# Patient Record
Sex: Female | Born: 1960 | Race: Black or African American | Hispanic: No | Smoking: Never smoker
Health system: Southern US, Community
[De-identification: ages and names within clinical notes are randomized; demographics above are authoritative.]

## PROBLEM LIST (undated history)

## (undated) DIAGNOSIS — I10 Essential (primary) hypertension: Secondary | ICD-10-CM

## (undated) DIAGNOSIS — J45909 Unspecified asthma, uncomplicated: Secondary | ICD-10-CM

## (undated) HISTORY — PX: APPENDECTOMY: SHX54

## (undated) HISTORY — PX: ABDOMINAL HYSTERECTOMY: SHX81

---

## 1992-05-26 HISTORY — PX: BREAST BIOPSY: SHX20

## 2005-09-25 ENCOUNTER — Ambulatory Visit: Payer: Self-pay

## 2005-10-02 ENCOUNTER — Ambulatory Visit: Payer: Self-pay

## 2007-04-19 ENCOUNTER — Ambulatory Visit: Payer: Self-pay | Admitting: Unknown Physician Specialty

## 2007-09-27 ENCOUNTER — Emergency Department: Payer: Self-pay | Admitting: Emergency Medicine

## 2007-12-06 ENCOUNTER — Ambulatory Visit: Payer: Self-pay | Admitting: Internal Medicine

## 2009-01-10 ENCOUNTER — Emergency Department: Payer: Self-pay | Admitting: Emergency Medicine

## 2009-01-22 ENCOUNTER — Encounter: Payer: Self-pay | Admitting: Internal Medicine

## 2009-01-24 ENCOUNTER — Encounter: Payer: Self-pay | Admitting: Internal Medicine

## 2009-02-23 ENCOUNTER — Encounter: Payer: Self-pay | Admitting: Internal Medicine

## 2010-07-11 ENCOUNTER — Ambulatory Visit: Payer: Self-pay

## 2010-07-12 ENCOUNTER — Other Ambulatory Visit: Payer: Self-pay | Admitting: Internal Medicine

## 2011-04-09 ENCOUNTER — Emergency Department: Payer: Self-pay

## 2011-10-01 ENCOUNTER — Ambulatory Visit: Payer: Self-pay | Admitting: Internal Medicine

## 2012-02-01 ENCOUNTER — Emergency Department: Payer: Self-pay | Admitting: Emergency Medicine

## 2013-02-08 ENCOUNTER — Ambulatory Visit: Payer: Self-pay | Admitting: Internal Medicine

## 2013-02-22 ENCOUNTER — Ambulatory Visit: Payer: Self-pay | Admitting: Internal Medicine

## 2013-05-09 ENCOUNTER — Ambulatory Visit: Payer: Self-pay | Admitting: Unknown Physician Specialty

## 2013-05-11 LAB — PATHOLOGY REPORT

## 2013-10-06 ENCOUNTER — Ambulatory Visit: Payer: Self-pay | Admitting: Family Medicine

## 2014-04-14 ENCOUNTER — Ambulatory Visit: Payer: Self-pay | Admitting: Family Medicine

## 2014-04-25 ENCOUNTER — Ambulatory Visit: Payer: Self-pay | Admitting: Family Medicine

## 2014-08-31 ENCOUNTER — Encounter: Payer: Self-pay | Admitting: *Deleted

## 2014-09-12 ENCOUNTER — Emergency Department: Admit: 2014-09-12 | Disposition: A | Discharge status: Home / Self Care | Payer: Self-pay | Admitting: Student

## 2014-09-12 LAB — COMPREHENSIVE METABOLIC PANEL
Albumin: 4.3 g/dL
Alkaline Phosphatase: 89 U/L
Anion Gap: 5 — ABNORMAL LOW (ref 7–16)
BILIRUBIN TOTAL: 0.7 mg/dL
BUN: 9 mg/dL
CALCIUM: 8.8 mg/dL — AB
Chloride: 108 mmol/L
Co2: 25 mmol/L
Creatinine: 0.58 mg/dL
EGFR (African American): >60
Glucose: 88 mg/dL
POTASSIUM: 3.5 mmol/L
SGOT(AST): 21 U/L
SGPT (ALT): 18 U/L
Sodium: 138 mmol/L
Total Protein: 7.7 g/dL

## 2014-09-12 LAB — CBC
HCT: 43.2 % (ref 35.0–47.0)
HGB: 14.6 g/dL (ref 12.0–16.0)
MCH: 30.5 pg (ref 26.0–34.0)
MCHC: 33.8 g/dL (ref 32.0–36.0)
MCV: 90 fL (ref 80–100)
Platelet: 234 10*3/uL (ref 150–440)
RBC: 4.79 10*6/uL (ref 3.80–5.20)
RDW: 13.1 % (ref 11.5–14.5)
WBC: 7.3 10*3/uL (ref 3.6–11.0)

## 2014-09-12 LAB — URINALYSIS, COMPLETE
BILIRUBIN, UR: NEGATIVE
Glucose,UR: NEGATIVE mg/dL (ref 0–75)
Ketone: NEGATIVE
Leukocyte Esterase: NEGATIVE
Nitrite: NEGATIVE
PH: 6 (ref 4.5–8.0)
Protein: NEGATIVE
Specific Gravity: 1.006 (ref 1.003–1.030)

## 2014-10-10 ENCOUNTER — Other Ambulatory Visit: Payer: Self-pay | Admitting: Family Medicine

## 2014-10-10 DIAGNOSIS — N83201 Unspecified ovarian cyst, right side: Secondary | ICD-10-CM

## 2014-10-13 ENCOUNTER — Ambulatory Visit: Payer: Self-pay

## 2014-11-17 ENCOUNTER — Other Ambulatory Visit: Payer: Self-pay | Admitting: Family Medicine

## 2014-11-17 DIAGNOSIS — R928 Other abnormal and inconclusive findings on diagnostic imaging of breast: Secondary | ICD-10-CM

## 2014-11-30 ENCOUNTER — Ambulatory Visit
Admission: RE | Admit: 2014-11-30 | Discharge: 2014-11-30 | Disposition: A | Discharge status: Home / Self Care | Payer: 59 | Source: Ambulatory Visit | Attending: Family Medicine | Admitting: Family Medicine

## 2014-11-30 ENCOUNTER — Other Ambulatory Visit: Payer: Self-pay | Admitting: Family Medicine

## 2014-11-30 DIAGNOSIS — R928 Other abnormal and inconclusive findings on diagnostic imaging of breast: Secondary | ICD-10-CM

## 2014-11-30 DIAGNOSIS — N63 Unspecified lump in breast: Secondary | ICD-10-CM | POA: Diagnosis not present

## 2015-02-01 ENCOUNTER — Ambulatory Visit: Payer: 59 | Attending: Physician Assistant | Admitting: Physical Therapy

## 2015-02-01 ENCOUNTER — Encounter: Payer: Self-pay | Admitting: Physical Therapy

## 2015-02-01 DIAGNOSIS — R531 Weakness: Secondary | ICD-10-CM | POA: Diagnosis present

## 2015-02-01 DIAGNOSIS — M79604 Pain in right leg: Secondary | ICD-10-CM | POA: Insufficient documentation

## 2015-02-01 DIAGNOSIS — M545 Low back pain, unspecified: Secondary | ICD-10-CM

## 2015-02-01 NOTE — Therapy (Signed)
[facility] MAIN [facility] SERVICES [address] [place], [place], [address] Phone: [phone]   Fax:  [phone]  Physical Therapy Evaluation  Patient Details  Name: Roberta Goodwin MRN: [id number] Date of Birth: August 21, 1960 Referring Provider:  Allen Derry, MD  Encounter Date: 02/01/2015      PT End of Session - 02/01/15 1329    Visit Number 1   Number of Visits 17   Date for PT Re-Evaluation 03/29/15   PT Start Time 0100   PT Stop Time 0200   PT Time Calculation (min) 60 min   Activity Tolerance Patient limited by fatigue;Patient limited by pain   Behavior During Therapy [facility] for tasks assessed/performed      No past medical history on file.  Past Surgical History  Procedure Laterality Date  . Breast biopsy Left 1994    negative    There were no vitals filed for this visit.  Visit Diagnosis:  Low back pain at multiple sites  Weakness  Right leg pain      Subjective Assessment - 02/01/15 1307    Subjective Patient has constant pain that goes down her RLE across her thigh to her left knee.    Currently in Pain? Yes   Pain Location Leg   Pain Orientation Left   Pain Descriptors / Indicators Aching   Pain Type Chronic pain   Pain Onset Other (comment)  one year   Pain Frequency Constant   Aggravating Factors  walking, sitting,    Pain Relieving Factors medicine, posititons in bed sometimes makes it better , lying    Evaluation findings;  Gait : WNL short, intermediate and long distances Strength 3+/5 hip extension bilaterally, hip flex bilaterally 3/5 with pain, ankle strength bilatrerally 5/5 ROM trunk guarded and repeated movements do not make it better SB R is pain ful, SB Left is tender, flex no pain but guarded, extension Sensation: WFL BLE Special tests : - SLR bilaterally, + FABER RLE,  + spring test L1- L5 Outcome measures: TUG : 6.32 sec 10MW:1.74 m/sec 5 x sit to stand: 15.21  6 MW:1225 feet      [facility]  PT Assessment - 02/01/15 0001    Assessment   Medical Diagnosis LBP   Onset Date/Surgical Date 11/24/14   Hand Dominance Right   Next MD Visit 02/15/15   Home Environment   Living Environment Private residence   Living Arrangements Alone   Available Help at Discharge Friend(s)   Type of Home Apartment   Home Access Stairs to enter   Prior Function   Level of Independence Independent                           PT Education - 02/01/15 1329    Education provided Yes   Education Details ice and HEP   Person(s) Educated Patient   Methods Explanation   Comprehension Verbalized understanding             PT Long Term Goals - 02/01/15 1342    PT LONG TERM GOAL #1   Title Patient will be independent in home exercise program to improve strength/mobility for better functional independence with ADLs  11//16   PT LONG TERM GOAL #2   Title Patient (< 60 years old) will complete five times sit to stand test in < 10 seconds indicating an increased LE strength and improved balance  03/29/15   PT LONG TERM GOAL #3  Title Patient will report a worst pain of 3/10 on VAS in  right leg    to improve tolerance with ADLs and reduced symptoms with activities 03/29/15               Plan - 02/01/15 1330    Clinical Impression Statement Patient is 54 yr old female with RLE sciatiac pain that is constatnt 5/10 and LBP that has been duration of one year. She has poor ROM trunk and + R FABER, She has tenderness to palpation to L1-L5 and paraspinal musculature.    Pt will benefit from skilled therapeutic intervention in order to improve on the following deficits Pain;Decreased range of motion;Decreased mobility;Decreased strength   Rehab Potential Good   PT Frequency 2x / week   PT Duration 8 weeks   PT Treatment/Interventions Manual techniques;Therapeutic exercise;Therapeutic activities;Stair training;Electrical Stimulation;Cryotherapy;Moist Heat;Traction;Dry  needling;Patient/family education   PT Next Visit Plan core strengthening   PT Home Exercise Plan piriformis stretch and TA contraction wiht hooklying marching   Consulted and Agree with Plan of Care Patient         Problem List There are no active problems to display for this patient.   [name] 02/01/2015, 1:47 PM  [facility] MAIN [facility] SERVICES [address] [place], [place], [address] Phone: [phone]   Fax:  [phone]

## 2015-02-05 ENCOUNTER — Encounter: Payer: Self-pay | Admitting: Physical Therapy

## 2015-02-05 ENCOUNTER — Ambulatory Visit: Payer: 59 | Admitting: Physical Therapy

## 2015-02-05 DIAGNOSIS — M545 Low back pain, unspecified: Secondary | ICD-10-CM

## 2015-02-05 DIAGNOSIS — R531 Weakness: Secondary | ICD-10-CM

## 2015-02-05 DIAGNOSIS — M79604 Pain in right leg: Secondary | ICD-10-CM

## 2015-02-05 NOTE — Therapy (Signed)
[facility] MAIN [facility] SERVICES [address] [place], [place], [address] Phone: [phone]   Fax:  [phone]  Physical Therapy Treatment  Patient Details  Name: Roberta Goodwin MRN: [id number] Date of Birth: [birth year] Referring Provider:  Allen Derry, MD  Encounter Date: 02/05/2015      PT End of Session - 02/05/15 1711    Visit Number 2   Number of Visits 17   Date for PT Re-Evaluation 03/29/15   PT Start Time 0445   PT Stop Time 0530   PT Time Calculation (min) 45 min   Activity Tolerance Patient limited by fatigue;Patient limited by pain   Behavior During Therapy [facility] for tasks assessed/performed      History reviewed. No pertinent past medical history.  Past Surgical History  Procedure Laterality Date  . Breast biopsy Left 1994    negative    There were no vitals filed for this visit.  Visit Diagnosis:  Low back pain at multiple sites  Weakness  Right leg pain      Subjective Assessment - 02/05/15 1645    Subjective (p) Patient has constant pain that goes down her RLE across her thigh to her left knee.    Pain Onset (p) Other (comment)  one year      Manual therapy;  STM to paraspinal muscles PA glides L3,4 ,5 and S 1 Prone extension x 10 x 2 Prone extension with overpressure and 10 x 2  E-stim IFC to left low back and SI joint x 20 mintues                           PT Education - 02/05/15 1710    Education provided Yes   Education Details HEP progression   Person(s) Educated Patient   Methods Explanation   Comprehension Verbalized understanding             PT Long Term Goals - 02/01/15 1342    PT LONG TERM GOAL #1   Title Patient will be independent in home exercise program to improve strength/mobility for better functional independence with ADLs  11//16   PT LONG TERM GOAL #2   Title Patient (< 72 years old) will complete five times sit to stand test in < 10 seconds  indicating an increased LE strength and improved balance  03/29/15   PT LONG TERM GOAL #3   Title Patient will report a worst pain of 3/10 on VAS in  right leg    to improve tolerance with ADLs and reduced symptoms with activities 03/29/15               Plan - 02/05/15 1711    Clinical Impression Statement Patient is havng less pain to right buttocks and right knee. Patient has less tingling in RLE that gets better with long axis distraction and STM to paraspinal muscles.    Pt will benefit from skilled therapeutic intervention in order to improve on the following deficits Pain;Decreased range of motion;Decreased mobility;Decreased strength   Rehab Potential Good   PT Frequency 2x / week   PT Duration 8 weeks   PT Treatment/Interventions Manual techniques;Therapeutic exercise;Therapeutic activities;Stair training;Electrical Stimulation;Cryotherapy;Moist Heat;Traction;Dry needling;Patient/family education   PT Next Visit Plan core strengthening   PT Home Exercise Plan piriformis stretch and TA contraction wiht hooklying marching   Consulted and Agree with Plan of Care Patient        Problem List There are  no active problems to display for this patient.   Roberta Goodwin 02/05/2015, 5:17 PM  [facility] MAIN [facility] SERVICES [address] [place], [place], [address] Phone: [phone]   Fax:  [phone]

## 2015-02-07 ENCOUNTER — Ambulatory Visit: Payer: 59 | Admitting: Physical Therapy

## 2015-02-12 ENCOUNTER — Encounter: Payer: Self-pay | Admitting: Physical Therapy

## 2015-02-12 ENCOUNTER — Ambulatory Visit: Payer: 59 | Admitting: Physical Therapy

## 2015-02-12 DIAGNOSIS — M545 Low back pain, unspecified: Secondary | ICD-10-CM

## 2015-02-12 DIAGNOSIS — R531 Weakness: Secondary | ICD-10-CM

## 2015-02-12 DIAGNOSIS — M79604 Pain in right leg: Secondary | ICD-10-CM

## 2015-02-12 NOTE — Therapy (Signed)
[facility] MAIN [facility] SERVICES [address] [place], [place], [address] Phone: [phone]   Fax:  [phone]  Physical Therapy Treatment  Patient Details  Name: Roberta Goodwin MRN: [id number] Date of Birth: 1960/09/18 Referring Provider:  Allen Derry, MD  Encounter Date: 02/12/2015      PT End of Session - 02/12/15 1705    Visit Number 3   Number of Visits 17   Date for PT Re-Evaluation 03/29/15   PT Start Time 0445   PT Stop Time 0530   PT Time Calculation (min) 45 min   Activity Tolerance Patient limited by fatigue;Patient limited by pain   Behavior During Therapy [facility] for tasks assessed/performed      History reviewed. No pertinent past medical history.  Past Surgical History  Procedure Laterality Date  . Breast biopsy Left 1994    negative    There were no vitals filed for this visit.  Visit Diagnosis:  Low back pain at multiple sites  Weakness  Right leg pain      Subjective Assessment - 02/12/15 1701    Subjective Patient has pain in her back and across R:E 6/10. It goes away if she is sleeping   Pain Onset Other (comment)  one year        E-stim to low back IFC and crossed pattern x 20 minuets Prone extension exercises x 10 x 2 with overpressure PA glides grade 2 and 3 to L5-S1 ,  STM to paraspinal muscles  Patients pain decreases to 4/10 in right thigh                           PT Education - 02/12/15 1704    Education provided Yes   Education Details HEP   Person(s) Educated Patient   Methods Explanation   Comprehension Verbalized understanding             PT Long Term Goals - 02/01/15 1342    PT LONG TERM GOAL #1   Title Patient will be independent in home exercise program to improve strength/mobility for better functional independence with ADLs  11//16   PT LONG TERM GOAL #2   Title Patient (< 14 years old) will complete five times sit to stand test in < 10 seconds  indicating an increased LE strength and improved balance  03/29/15   PT LONG TERM GOAL #3   Title Patient will report a worst pain of 3/10 on VAS in  right leg    to improve tolerance with ADLs and reduced symptoms with activities 03/29/15               Plan - 02/12/15 1705    Clinical Impression Statement Patient responds well to e-stim and  prone extension exercises.    Pt will benefit from skilled therapeutic intervention in order to improve on the following deficits Pain;Decreased range of motion;Decreased mobility;Decreased strength   Rehab Potential Good   PT Frequency 2x / week   PT Duration 8 weeks   PT Treatment/Interventions Manual techniques;Therapeutic exercise;Therapeutic activities;Stair training;Electrical Stimulation;Cryotherapy;Moist Heat;Traction;Dry needling;Patient/family education   PT Next Visit Plan core strengthening   PT Home Exercise Plan piriformis stretch and TA contraction wiht hooklying marching   Consulted and Agree with Plan of Care Patient        Problem List There are no active problems to display for this patient.   Jones Broom S 02/12/2015, 5:16 PM  [facility] MAIN [facility] SERVICES [address] [place], [place], [address] Phone: [phone]   Fax:  [phone]

## 2015-02-14 ENCOUNTER — Encounter: Payer: Self-pay | Admitting: Physical Therapy

## 2015-02-14 ENCOUNTER — Ambulatory Visit: Payer: 59 | Admitting: Physical Therapy

## 2015-02-14 DIAGNOSIS — M545 Low back pain, unspecified: Secondary | ICD-10-CM

## 2015-02-14 DIAGNOSIS — M79604 Pain in right leg: Secondary | ICD-10-CM

## 2015-02-14 DIAGNOSIS — R531 Weakness: Secondary | ICD-10-CM

## 2015-02-14 NOTE — Therapy (Signed)
[facility] MAIN [facility] SERVICES [address] [place], [place], [address] Phone: [phone]   Fax:  [phone]  Physical Therapy Treatment  Patient Details  Name: Roberta Goodwin MRN: [id number] Date of Birth: December 15, 1960 Referring Provider:  Allen Derry, MD  Encounter Date: 02/14/2015      PT End of Session - 02/14/15 1728    Visit Number 4   Number of Visits 17   Date for PT Re-Evaluation 03/29/15   PT Start Time 0515   PT Stop Time 0555   PT Time Calculation (min) 40 min   Activity Tolerance Patient limited by fatigue;Patient limited by pain   Behavior During Therapy [facility] for tasks assessed/performed      History reviewed. No pertinent past medical history.  Past Surgical History  Procedure Laterality Date  . Breast biopsy Left 1994    negative    There were no vitals filed for this visit.  Visit Diagnosis:  Low back pain at multiple sites  Weakness  Right leg pain      Subjective Assessment - 02/14/15 1716    Subjective Patient has pain in her back and across R:E 7/10. It goes away if she is sleeping   Currently in Pain? Yes   Pain Score 7           E-stim to low back IFC and crossed pattern x 20 minuets Prone extension exercises x 10 x 2 with overpressure Long axis distraction to RLE grade 3  STM to paraspinal muscles  Patients pain decreases to 5/10 in right thigh                              PT Long Term Goals - 02/01/15 1342    PT LONG TERM GOAL #1   Title Patient will be independent in home exercise program to improve strength/mobility for better functional independence with ADLs  11//16   PT LONG TERM GOAL #2   Title Patient (< 53 years old) will complete five times sit to stand test in < 10 seconds indicating an increased LE strength and improved balance  03/29/15   PT LONG TERM GOAL #3   Title Patient will report a worst pain of 3/10 on VAS in  right leg    to improve  tolerance with ADLs and reduced symptoms with activities 03/29/15               Plan - 02/14/15 1729    Clinical Impression Statement Patient has no relief frim long axis distraction but does have relief from e-stim and MH. She continues to have pain that wraps around her thigh into her knee on the right side.    Pt will benefit from skilled therapeutic intervention in order to improve on the following deficits Pain;Decreased range of motion;Decreased mobility;Decreased strength   Rehab Potential Good   PT Frequency 2x / week   PT Duration 8 weeks   PT Treatment/Interventions Manual techniques;Therapeutic exercise;Therapeutic activities;Stair training;Electrical Stimulation;Cryotherapy;Moist Heat;Traction;Dry needling;Patient/family education   PT Next Visit Plan core strengthening   PT Home Exercise Plan piriformis stretch and TA contraction wiht hooklying marching   Consulted and Agree with Plan of Care Patient        Problem List There are no active problems to display for this patient.   Jones Broom S 02/14/2015, 5:33 PM  [facility] [facility] MAIN [facility] SERVICES [address] [place],  [place], [address] Phone: [phone]   Fax:  [phone]

## 2015-02-19 ENCOUNTER — Other Ambulatory Visit: Payer: Self-pay | Admitting: Physical Medicine and Rehabilitation

## 2015-02-19 DIAGNOSIS — M5136 Other intervertebral disc degeneration, lumbar region: Secondary | ICD-10-CM

## 2015-02-28 ENCOUNTER — Ambulatory Visit
Admission: RE | Admit: 2015-02-28 | Discharge: 2015-02-28 | Disposition: A | Discharge status: Home / Self Care | Payer: 59 | Source: Ambulatory Visit | Attending: Physical Medicine and Rehabilitation | Admitting: Physical Medicine and Rehabilitation

## 2015-02-28 DIAGNOSIS — M5136 Other intervertebral disc degeneration, lumbar region: Secondary | ICD-10-CM | POA: Insufficient documentation

## 2015-03-06 ENCOUNTER — Encounter: Payer: Self-pay | Admitting: Physical Therapy

## 2015-03-06 ENCOUNTER — Ambulatory Visit: Payer: 59 | Attending: Physician Assistant | Admitting: Physical Therapy

## 2015-03-06 DIAGNOSIS — M545 Low back pain, unspecified: Secondary | ICD-10-CM

## 2015-03-06 DIAGNOSIS — R531 Weakness: Secondary | ICD-10-CM | POA: Diagnosis present

## 2015-03-06 DIAGNOSIS — M79604 Pain in right leg: Secondary | ICD-10-CM | POA: Insufficient documentation

## 2015-03-06 NOTE — Therapy (Signed)
[facility] MAIN [facility] SERVICES [address] [place], [place], [address] Phone: [phone]   Fax:  [phone]  Physical Therapy Treatment  Patient Details  Name: Roberta Goodwin MRN: [id number] Date of Birth: Oct 18, 1960 Referring Provider:  Allen Derry, MD  Encounter Date: 03/06/2015      PT End of Session - 03/06/15 1756    Visit Number 5   Number of Visits 17   Date for PT Re-Evaluation 03/29/15   PT Start Time 0530   PT Stop Time 0615   PT Time Calculation (min) 45 min   Activity Tolerance Patient limited by fatigue;Patient limited by pain   Behavior During Therapy [facility] for tasks assessed/performed      History reviewed. No pertinent past medical history.  Past Surgical History  Procedure Laterality Date  . Breast biopsy Left 1994    negative    There were no vitals filed for this visit.  Visit Diagnosis:  Low back pain at multiple sites  Weakness  Right leg pain      Subjective Assessment - 03/06/15 1755    Subjective Patient has pain in her back and across R:E 4/10. It goes away if she is sleeping   Pain Score 4    Pain Location Leg        Manual therpay: Long axis distraction grade 4 to RLE x 30 bouts x 5 PA glides grade 3 and 4 to L1- L5 x 30 bouts  Prone extension with overpressure to L 3, L4, L5 x 30 bouts x 3 TM x 10 mintues at 2.4 m /sec  Reviewed body mechanics and positioning and HEP.                          PT Education - 03/06/15 1756    Education provided Yes   Education Details HEP   Person(s) Educated Patient   Methods Explanation   Comprehension Verbalized understanding             PT Long Term Goals - 02/01/15 1342    PT LONG TERM GOAL #1   Title Patient will be independent in home exercise program to improve strength/mobility for better functional independence with ADLs  11//16   PT LONG TERM GOAL #2   Title Patient (< 80 years old) will complete five  times sit to stand test in < 10 seconds indicating an increased LE strength and improved balance  03/29/15   PT LONG TERM GOAL #3   Title Patient will report a worst pain of 3/10 on VAS in  right leg    to improve tolerance with ADLs and reduced symptoms with activities 03/29/15               Plan - 03/06/15 1757    Clinical Impression Statement Patient has pain relief after manual therapy and long axis distraction.    Pt will benefit from skilled therapeutic intervention in order to improve on the following deficits Pain;Decreased range of motion;Decreased mobility;Decreased strength   Rehab Potential Good   PT Frequency 2x / week   PT Duration 8 weeks   PT Treatment/Interventions Manual techniques;Therapeutic exercise;Therapeutic activities;Stair training;Electrical Stimulation;Cryotherapy;Moist Heat;Traction;Dry needling;Patient/family education   PT Next Visit Plan core strengthening   PT Home Exercise Plan piriformis stretch and TA contraction wiht hooklying marching   Consulted and Agree with Plan of Care Patient        Problem List There are no active  problems to display for this patient.   [name] 03/06/2015, 5:58 PM  [facility] MAIN [facility] SERVICES [address] [place], [place], [address] Phone: [phone]   Fax:  [phone]

## 2015-03-14 ENCOUNTER — Encounter: Payer: 59 | Admitting: Physical Therapy

## 2015-06-04 DIAGNOSIS — M5441 Lumbago with sciatica, right side: Secondary | ICD-10-CM | POA: Diagnosis not present

## 2015-06-28 DIAGNOSIS — G8929 Other chronic pain: Secondary | ICD-10-CM | POA: Diagnosis not present

## 2015-06-28 DIAGNOSIS — M5136 Other intervertebral disc degeneration, lumbar region: Secondary | ICD-10-CM | POA: Diagnosis not present

## 2015-06-28 DIAGNOSIS — M5441 Lumbago with sciatica, right side: Secondary | ICD-10-CM | POA: Diagnosis not present

## 2015-09-01 DIAGNOSIS — H524 Presbyopia: Secondary | ICD-10-CM | POA: Diagnosis not present

## 2015-09-13 DIAGNOSIS — M5432 Sciatica, left side: Secondary | ICD-10-CM | POA: Diagnosis not present

## 2015-10-15 DIAGNOSIS — I10 Essential (primary) hypertension: Secondary | ICD-10-CM | POA: Diagnosis not present

## 2015-10-15 DIAGNOSIS — L819 Disorder of pigmentation, unspecified: Secondary | ICD-10-CM | POA: Diagnosis not present

## 2015-10-15 DIAGNOSIS — R928 Other abnormal and inconclusive findings on diagnostic imaging of breast: Secondary | ICD-10-CM | POA: Diagnosis not present

## 2015-10-18 DIAGNOSIS — R928 Other abnormal and inconclusive findings on diagnostic imaging of breast: Secondary | ICD-10-CM | POA: Diagnosis not present

## 2015-10-18 DIAGNOSIS — N63 Unspecified lump in breast: Secondary | ICD-10-CM | POA: Diagnosis not present

## 2015-11-14 DIAGNOSIS — L608 Other nail disorders: Secondary | ICD-10-CM | POA: Diagnosis not present

## 2015-11-22 DIAGNOSIS — N63 Unspecified lump in breast: Secondary | ICD-10-CM | POA: Diagnosis not present

## 2016-05-05 DIAGNOSIS — I10 Essential (primary) hypertension: Secondary | ICD-10-CM | POA: Diagnosis not present

## 2016-05-05 DIAGNOSIS — M722 Plantar fascial fibromatosis: Secondary | ICD-10-CM | POA: Diagnosis not present

## 2016-05-05 DIAGNOSIS — Z0000 Encounter for general adult medical examination without abnormal findings: Secondary | ICD-10-CM | POA: Diagnosis not present

## 2016-05-05 DIAGNOSIS — K635 Polyp of colon: Secondary | ICD-10-CM | POA: Diagnosis not present

## 2016-05-12 DIAGNOSIS — M722 Plantar fascial fibromatosis: Secondary | ICD-10-CM | POA: Diagnosis not present

## 2016-10-07 DIAGNOSIS — H524 Presbyopia: Secondary | ICD-10-CM | POA: Diagnosis not present

## 2016-10-15 DIAGNOSIS — Z803 Family history of malignant neoplasm of breast: Secondary | ICD-10-CM | POA: Diagnosis not present

## 2016-10-15 DIAGNOSIS — M7989 Other specified soft tissue disorders: Secondary | ICD-10-CM | POA: Diagnosis not present

## 2016-10-15 DIAGNOSIS — Z87898 Personal history of other specified conditions: Secondary | ICD-10-CM | POA: Diagnosis not present

## 2016-10-16 ENCOUNTER — Other Ambulatory Visit: Payer: Self-pay | Admitting: Family Medicine

## 2016-10-16 DIAGNOSIS — Z87898 Personal history of other specified conditions: Secondary | ICD-10-CM

## 2016-10-16 DIAGNOSIS — Z803 Family history of malignant neoplasm of breast: Secondary | ICD-10-CM

## 2016-10-16 DIAGNOSIS — M7989 Other specified soft tissue disorders: Secondary | ICD-10-CM

## 2016-10-17 ENCOUNTER — Other Ambulatory Visit: Payer: Self-pay | Admitting: *Deleted

## 2016-10-17 ENCOUNTER — Inpatient Hospital Stay
Admission: RE | Admit: 2016-10-17 | Discharge: 2016-10-17 | Disposition: A | Discharge status: Home / Self Care | Payer: Self-pay | Source: Ambulatory Visit | Attending: *Deleted | Admitting: *Deleted

## 2016-10-17 DIAGNOSIS — Z9289 Personal history of other medical treatment: Secondary | ICD-10-CM

## 2016-10-22 ENCOUNTER — Other Ambulatory Visit: Payer: 59

## 2016-10-22 ENCOUNTER — Other Ambulatory Visit: Payer: Self-pay | Admitting: Family Medicine

## 2016-10-22 DIAGNOSIS — R590 Localized enlarged lymph nodes: Secondary | ICD-10-CM

## 2016-10-22 DIAGNOSIS — Z803 Family history of malignant neoplasm of breast: Secondary | ICD-10-CM

## 2016-10-22 DIAGNOSIS — Z87898 Personal history of other specified conditions: Secondary | ICD-10-CM

## 2016-10-22 DIAGNOSIS — M7989 Other specified soft tissue disorders: Secondary | ICD-10-CM

## 2016-10-30 ENCOUNTER — Ambulatory Visit
Admission: RE | Admit: 2016-10-30 | Discharge: 2016-10-30 | Disposition: A | Discharge status: Home / Self Care | Payer: 59 | Source: Ambulatory Visit | Attending: Family Medicine | Admitting: Family Medicine

## 2016-10-30 DIAGNOSIS — Z803 Family history of malignant neoplasm of breast: Secondary | ICD-10-CM | POA: Diagnosis not present

## 2016-10-30 DIAGNOSIS — N63 Unspecified lump in unspecified breast: Secondary | ICD-10-CM | POA: Diagnosis not present

## 2016-10-30 DIAGNOSIS — Z87898 Personal history of other specified conditions: Secondary | ICD-10-CM | POA: Insufficient documentation

## 2016-10-30 DIAGNOSIS — R928 Other abnormal and inconclusive findings on diagnostic imaging of breast: Secondary | ICD-10-CM | POA: Diagnosis not present

## 2016-10-30 DIAGNOSIS — N6489 Other specified disorders of breast: Secondary | ICD-10-CM | POA: Diagnosis not present

## 2016-10-30 DIAGNOSIS — R590 Localized enlarged lymph nodes: Secondary | ICD-10-CM

## 2016-10-30 DIAGNOSIS — M7989 Other specified soft tissue disorders: Secondary | ICD-10-CM | POA: Insufficient documentation

## 2017-06-15 ENCOUNTER — Emergency Department: Payer: 59

## 2017-06-15 ENCOUNTER — Emergency Department
Admission: EM | Admit: 2017-06-15 | Discharge: 2017-06-15 | Disposition: A | Discharge status: Home / Self Care | Payer: 59 | Attending: Emergency Medicine | Admitting: Emergency Medicine

## 2017-06-15 ENCOUNTER — Other Ambulatory Visit: Payer: Self-pay

## 2017-06-15 ENCOUNTER — Encounter: Payer: Self-pay | Admitting: Emergency Medicine

## 2017-06-15 DIAGNOSIS — R079 Chest pain, unspecified: Secondary | ICD-10-CM | POA: Diagnosis not present

## 2017-06-15 DIAGNOSIS — J40 Bronchitis, not specified as acute or chronic: Secondary | ICD-10-CM | POA: Diagnosis not present

## 2017-06-15 DIAGNOSIS — I10 Essential (primary) hypertension: Secondary | ICD-10-CM | POA: Insufficient documentation

## 2017-06-15 DIAGNOSIS — R51 Headache: Secondary | ICD-10-CM | POA: Diagnosis not present

## 2017-06-15 DIAGNOSIS — R05 Cough: Secondary | ICD-10-CM | POA: Diagnosis not present

## 2017-06-15 DIAGNOSIS — R0602 Shortness of breath: Secondary | ICD-10-CM | POA: Diagnosis not present

## 2017-06-15 HISTORY — DX: Essential (primary) hypertension: I10

## 2017-06-15 LAB — COMPREHENSIVE METABOLIC PANEL
ALT: 35 U/L (ref 14–54)
ANION GAP: 10 (ref 5–15)
AST: 33 U/L (ref 15–41)
Albumin: 4.3 g/dL (ref 3.5–5.0)
Alkaline Phosphatase: 118 U/L (ref 38–126)
BUN: 12 mg/dL (ref 6–20)
CHLORIDE: 105 mmol/L (ref 101–111)
CO2: 21 mmol/L — AB (ref 22–32)
Calcium: 10 mg/dL (ref 8.9–10.3)
Creatinine, Ser: 0.81 mg/dL (ref 0.44–1.00)
Glucose, Bld: 104 mg/dL — ABNORMAL HIGH (ref 65–99)
POTASSIUM: 3.4 mmol/L — AB (ref 3.5–5.1)
SODIUM: 136 mmol/L (ref 135–145)
Total Bilirubin: 0.5 mg/dL (ref 0.3–1.2)
Total Protein: 8.1 g/dL (ref 6.5–8.1)

## 2017-06-15 LAB — CBC
HCT: 45.4 % (ref 35.0–47.0)
Hemoglobin: 15.4 g/dL (ref 12.0–16.0)
MCH: 30.1 pg (ref 26.0–34.0)
MCHC: 34.1 g/dL (ref 32.0–36.0)
MCV: 88.3 fL (ref 80.0–100.0)
PLATELETS: 220 10*3/uL (ref 150–440)
RBC: 5.13 MIL/uL (ref 3.80–5.20)
RDW: 13.1 % (ref 11.5–14.5)
WBC: 9.4 10*3/uL (ref 3.6–11.0)

## 2017-06-15 LAB — FIBRIN DERIVATIVES D-DIMER (ARMC ONLY): FIBRIN DERIVATIVES D-DIMER (ARMC): 253.99 ng{FEU}/mL (ref 0.00–499.00)

## 2017-06-15 LAB — INFLUENZA PANEL BY PCR (TYPE A & B)
INFLAPCR: NEGATIVE
INFLBPCR: NEGATIVE

## 2017-06-15 LAB — TROPONIN I

## 2017-06-15 MED ORDER — AZITHROMYCIN 500 MG PO TABS
500.0000 mg | ORAL_TABLET | Freq: Once | ORAL | Status: AC
  Administered 2017-06-15: 500 mg via ORAL
  Filled 2017-06-15: qty 1

## 2017-06-15 MED ORDER — IPRATROPIUM-ALBUTEROL 0.5-2.5 (3) MG/3ML IN SOLN
3.0000 mL | Freq: Once | RESPIRATORY_TRACT | Status: AC
  Administered 2017-06-15: 3 mL via RESPIRATORY_TRACT
  Filled 2017-06-15: qty 3

## 2017-06-15 MED ORDER — PREDNISONE 10 MG PO TABS: ORAL_TABLET | ORAL | 0 refills | Status: DC

## 2017-06-15 MED ORDER — IBUPROFEN 400 MG PO TABS
600.0000 mg | ORAL_TABLET | Freq: Once | ORAL | Status: AC
  Administered 2017-06-15: 600 mg via ORAL
  Filled 2017-06-15: qty 2

## 2017-06-15 MED ORDER — SODIUM CHLORIDE 0.9 % IV BOLUS (SEPSIS)
1000.0000 mL | Freq: Once | INTRAVENOUS | Status: AC
  Administered 2017-06-15: 1000 mL via INTRAVENOUS

## 2017-06-15 MED ORDER — AZITHROMYCIN 250 MG PO TABS: ORAL_TABLET | ORAL | 0 refills | Status: DC

## 2017-06-15 MED ORDER — PREDNISONE 20 MG PO TABS
40.0000 mg | ORAL_TABLET | Freq: Once | ORAL | Status: AC
  Administered 2017-06-15: 40 mg via ORAL
  Filled 2017-06-15: qty 2

## 2017-06-15 NOTE — ED Provider Notes (Signed)
[facility] Emergency Department Provider Note ____________________________________________   I have reviewed the triage vital signs and the triage nursing note.  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  She took a cough medication which included phentermine prior to arrival today.  Symptoms are moderate.  Nothing makes it worse or better.   Past Medical History:  Diagnosis Date  . Hypertension     There are no active problems to display for this patient.   Past Surgical History:  Procedure Laterality Date  . ABDOMINAL HYSTERECTOMY    . BREAST BIOPSY Left 1994   negative    Prior to Admission medications   Medication Sig Start Date End Date Taking? Authorizing Provider  azithromycin (ZITHROMAX) 250 MG tablet 1 tab by mouth for 4 more days 06/15/17   Governor Rooks, MD  predniSONE (DELTASONE) 10 MG tablet 40mg  by mouth for 4 more days 06/15/17   Governor Rooks, MD    Allergies  Allergen Reactions  . Lisinopril Cough  . Prednisone Anxiety    Family History  Problem Relation Age of Onset  . Breast cancer Mother 29    Social History Social History   Tobacco Use  . Smoking status: Never Smoker  Substance Use Topics  . Alcohol use: Not on file  . Drug use: Not on file    Review of Systems  Constitutional: Negative for fever.  Today for chills. Eyes: Negative for visual changes. ENT: Negative for sore throat. Cardiovascular: Having some pains that she considers compressing type of pains or bandlike pains around the sides of her chest. Respiratory: Positive for shortness of  breath. Gastrointestinal: Negative for abdominal pain, vomiting and diarrhea. Genitourinary: Negative for dysuria. Musculoskeletal: Negative for back pain.  Today for body and muscle aches. Skin: Negative for rash. Neurological: Positive for mild global headache.  ____________________________________________   PHYSICAL EXAM:  VITAL SIGNS: ED Triage Vitals  Enc Vitals Group     BP 06/15/17 1112 (!) 179/113     Pulse Rate 06/15/17 1112 (!) 109     Resp 06/15/17 1112 20     Temp 06/15/17 1112 98.7 F (37.1 C)     Temp Source 06/15/17 1112 Oral     SpO2 06/15/17 1112 94 %     Weight 06/15/17 1113 197 lb (89.4 kg)     Height 06/15/17 1113 5\' 6"  (1.676 m)     Head Circumference --      Peak Flow --      Pain Score 06/15/17 1112 7     Pain Loc --      Pain Edu? --      Excl. in GC? --      Constitutional: Alert and oriented. Well appearing and in no distress. HEENT   Head: Normocephalic and atraumatic.      Eyes: Conjunctivae are normal. Pupils equal and round.       Ears:         Nose: No congestion/rhinnorhea.  Significant sinus congestion.   Mouth/Throat: Mucous membranes are moist.   Neck: No stridor. Cardiovascular/Chest: Tachycardic rate, regular rhythm.  No murmurs, rubs, or gallops. Respiratory: Normal  respiratory effort without tachypnea nor retractions.  Somewhat tight breath sounds without obvious wheezing, no rhonchi or rales. Gastrointestinal: Soft. No distention, no guarding, no rebound. Nontender.    Genitourinary/rectal:Deferred Musculoskeletal: Nontender with normal range of motion in all extremities. No joint effusions.  No lower extremity tenderness.  No edema. Neurologic:  Normal speech and language. No gross or focal neurologic deficits are appreciated. Skin:  Skin is warm, dry and intact. No rash noted. Psychiatric: Mood and affect are normal. Speech and behavior are normal. Patient exhibits appropriate insight and  judgment.   ____________________________________________  LABS (pertinent positives/negatives) I, Governor Rooksebecca Windie Marasco, MD the attending physician have reviewed the labs noted below.  Labs Reviewed  COMPREHENSIVE METABOLIC PANEL - Abnormal; Notable for the following components:      Result Value   Potassium 3.4 (*)    CO2 21 (*)    Glucose, Bld 104 (*)    All other components within normal limits  CBC  TROPONIN I  INFLUENZA PANEL BY PCR (TYPE A & B)  FIBRIN DERIVATIVES D-DIMER ([facility] ONLY)    ____________________________________________    EKG I, Governor Rooksebecca Zakhia Seres, MD, the attending physician have personally viewed and interpreted all ECGs.  110 bpm.  Sinus tachycardia.  Narrow QRS.  Normal axis.  Nonspecific ST and T wave ____________________________________________  RADIOLOGY All Xrays were viewed by me.  Imaging interpreted by Radiologist, and I, Governor Rooksebecca Delani Kohli, MD the attending physician have reviewed the radiologist interpretation noted below.  Chest x-ray 2 view:  IMPRESSION: There is no pneumonia nor other acute cardiopulmonary abnormality. __________________________________________  PROCEDURES  Procedure(s) performed: None  Critical Care performed: None   ____________________________________________  ED COURSE / ASSESSMENT AND PLAN  Pertinent labs & imaging results that were available during my care of the patient were reviewed by me and considered in my medical decision making (see chart for details).   Patient clearly has an upper respiratory infection with congestion and complaint of chest tightness with history of asthma.  No hypoxia or fever, but she does have chills here and seems like she is having some myalgias.  Her chest x-ray was clear for pneumonia.  We discussed treating for tight breath sounds with a history of asthma with a DuoNeb here.  I am going to add on an influenza test.  We discussed whether or not to pursue further investigation for PE  although I think patient is low risk given obvious other viral type symptoms for URI, we are going to pursue testing with d-dimer.  She is a little tachycardic here, this may be due to the stimulant and her cough medication prior to arrival.  Patient breathing little better after DuoNeb treatment.  She states that although prednisone gives her jitteriness, she states it does help with her lungs, so I am going to do a course of prednisone for bronchitis.  Given the systemic symptoms, I am also going to cover her with azithromycin and although we discussed it is not quite clear if this is bacterial or viral I will go ahead and cover in case there is a component of bacterial infection.  She has an appointment with her primary care doctor actually on Friday.  Negative for flu test.  D-dimer reassuring.  DIFFERENTIAL DIAGNOSIS: Including but not limited to influenza, upper respiratory viral infection, viral syndrome, pneumonia, asthma exacerbation, pulmonary embolism, ACS, bronchitis, sinusitis, etc.  CONSULTATIONS: None   Patient / Family / Caregiver informed of clinical course, medical decision-making process, and agree with plan.  I discussed return precautions, follow-up instructions, and discharge instructions with patient and/or family.  Discharge Instructions : You are evaluated for body aches and coughing and wheezing and are being treated for bronchitis/asthma exacerbation.  Continue to use your inhaler of albuterol every 4 hours as needed for wheezing.  You are being prescribed prednisone to help with inflammation in the lungs as well as azithromycin and a bacterial component to the upper respiratory infection.  Return to the emergency room immediately for any worsening condition including fevers, coughing up blood, dizziness or passing out, confusion or altered mental status, any chest pain or worsening trouble breathing, or any other symptoms concerning to  you.    ___________________________________________   FINAL CLINICAL IMPRESSION(S) / ED DIAGNOSES   Final diagnoses:  Bronchitis      ___________________________________________        Note: This dictation was prepared with Dragon dictation. Any transcriptional errors that result from this process are unintentional    Governor Rooks, MD 06/15/17 1553

## 2017-06-15 NOTE — ED Triage Notes (Signed)
States cough started yesterday. States chest pain started during the night but is unable to determine if it is related to her cough. Denies increase with inspiration.

## 2017-06-15 NOTE — Discharge Instructions (Signed)
You are evaluated for body aches and coughing and wheezing and are being treated for bronchitis/asthma exacerbation.  Continue to use your inhaler of albuterol every 4 hours as needed for wheezing.  You are being prescribed prednisone to help with inflammation in the lungs as well as azithromycin and a bacterial component to the upper respiratory infection.  Return to the emergency room immediately for any worsening condition including fevers, coughing up blood, dizziness or passing out, confusion or altered mental status, any chest pain or worsening trouble breathing, or any other symptoms concerning to you.

## 2017-06-19 DIAGNOSIS — Z23 Encounter for immunization: Secondary | ICD-10-CM | POA: Diagnosis not present

## 2017-06-19 DIAGNOSIS — Z6832 Body mass index (BMI) 32.0-32.9, adult: Secondary | ICD-10-CM | POA: Diagnosis not present

## 2017-06-19 DIAGNOSIS — Z0000 Encounter for general adult medical examination without abnormal findings: Secondary | ICD-10-CM | POA: Diagnosis not present

## 2017-06-19 DIAGNOSIS — I10 Essential (primary) hypertension: Secondary | ICD-10-CM | POA: Diagnosis not present

## 2017-06-22 DIAGNOSIS — E876 Hypokalemia: Secondary | ICD-10-CM | POA: Diagnosis not present

## 2017-07-20 DIAGNOSIS — E668 Other obesity: Secondary | ICD-10-CM | POA: Diagnosis not present

## 2017-07-20 DIAGNOSIS — Z6830 Body mass index (BMI) 30.0-30.9, adult: Secondary | ICD-10-CM | POA: Diagnosis not present

## 2017-08-25 DIAGNOSIS — Z6830 Body mass index (BMI) 30.0-30.9, adult: Secondary | ICD-10-CM | POA: Diagnosis not present

## 2017-08-25 DIAGNOSIS — R634 Abnormal weight loss: Secondary | ICD-10-CM | POA: Diagnosis not present

## 2017-10-05 DIAGNOSIS — H524 Presbyopia: Secondary | ICD-10-CM | POA: Diagnosis not present

## 2017-12-04 DIAGNOSIS — M21611 Bunion of right foot: Secondary | ICD-10-CM | POA: Diagnosis not present

## 2017-12-04 DIAGNOSIS — Z6829 Body mass index (BMI) 29.0-29.9, adult: Secondary | ICD-10-CM | POA: Diagnosis not present

## 2017-12-04 DIAGNOSIS — R0683 Snoring: Secondary | ICD-10-CM | POA: Diagnosis not present

## 2017-12-22 DIAGNOSIS — N390 Urinary tract infection, site not specified: Secondary | ICD-10-CM | POA: Diagnosis not present

## 2017-12-22 DIAGNOSIS — R399 Unspecified symptoms and signs involving the genitourinary system: Secondary | ICD-10-CM | POA: Diagnosis not present

## 2017-12-22 DIAGNOSIS — N12 Tubulo-interstitial nephritis, not specified as acute or chronic: Secondary | ICD-10-CM | POA: Diagnosis not present

## 2017-12-25 ENCOUNTER — Emergency Department
Admission: EM | Admit: 2017-12-25 | Discharge: 2017-12-25 | Discharge status: Against Medical Advice | Payer: 59 | Attending: Student in an Organized Health Care Education/Training Program | Admitting: Student in an Organized Health Care Education/Training Program

## 2017-12-25 ENCOUNTER — Other Ambulatory Visit: Payer: Self-pay

## 2017-12-25 ENCOUNTER — Encounter: Payer: Self-pay | Admitting: Emergency Medicine

## 2017-12-25 DIAGNOSIS — R82998 Other abnormal findings in urine: Secondary | ICD-10-CM | POA: Insufficient documentation

## 2017-12-25 DIAGNOSIS — Z1612 Extended spectrum beta lactamase (ESBL) resistance: Secondary | ICD-10-CM | POA: Insufficient documentation

## 2017-12-25 DIAGNOSIS — N3000 Acute cystitis without hematuria: Secondary | ICD-10-CM | POA: Diagnosis not present

## 2017-12-25 DIAGNOSIS — A499 Bacterial infection, unspecified: Secondary | ICD-10-CM

## 2017-12-25 HISTORY — DX: Unspecified asthma, uncomplicated: J45.909

## 2017-12-25 LAB — URINALYSIS, COMPLETE (UACMP) WITH MICROSCOPIC
Bacteria, UA: NONE SEEN
Bilirubin Urine: NEGATIVE
Glucose, UA: NEGATIVE mg/dL
Hgb urine dipstick: NEGATIVE
Ketones, ur: NEGATIVE mg/dL
LEUKOCYTES UA: NEGATIVE
NITRITE: NEGATIVE
PROTEIN: NEGATIVE mg/dL
SPECIFIC GRAVITY, URINE: 1.017 (ref 1.005–1.030)
Squamous Epithelial / LPF: NONE SEEN (ref 0–5)
pH: 7 (ref 5.0–8.0)

## 2017-12-25 LAB — COMPREHENSIVE METABOLIC PANEL
ALK PHOS: 97 U/L (ref 38–126)
ALT: 25 U/L (ref 0–44)
ANION GAP: 6 (ref 5–15)
AST: 29 U/L (ref 15–41)
Albumin: 4.1 g/dL (ref 3.5–5.0)
BILIRUBIN TOTAL: 0.5 mg/dL (ref 0.3–1.2)
BUN: 20 mg/dL (ref 6–20)
CALCIUM: 9.3 mg/dL (ref 8.9–10.3)
CO2: 24 mmol/L (ref 22–32)
Chloride: 110 mmol/L (ref 98–111)
Creatinine, Ser: 0.58 mg/dL (ref 0.44–1.00)
GFR calc non Af Amer: >60 mL/min (ref >60)
Glucose, Bld: 108 mg/dL — ABNORMAL HIGH (ref 70–99)
Potassium: 3.7 mmol/L (ref 3.5–5.1)
SODIUM: 140 mmol/L (ref 135–145)
TOTAL PROTEIN: 7 g/dL (ref 6.5–8.1)

## 2017-12-25 LAB — CBC
HCT: 39.6 % (ref 35.0–47.0)
Hemoglobin: 13.8 g/dL (ref 12.0–16.0)
MCH: 31.4 pg (ref 26.0–34.0)
MCHC: 35 g/dL (ref 32.0–36.0)
MCV: 90 fL (ref 80.0–100.0)
PLATELETS: 222 10*3/uL (ref 150–440)
RBC: 4.4 MIL/uL (ref 3.80–5.20)
RDW: 13 % (ref 11.5–14.5)
WBC: 8.1 10*3/uL (ref 3.6–11.0)

## 2017-12-25 MED ORDER — FLUCONAZOLE 150 MG PO TABS: 150.0000 mg | ORAL_TABLET | Freq: Every day | ORAL | 0 refills | Status: AC

## 2017-12-25 MED ORDER — NITROFURANTOIN MONOHYD MACRO 100 MG PO CAPS: 100.0000 mg | ORAL_CAPSULE | Freq: Two times a day (BID) | ORAL | 0 refills | Status: AC

## 2017-12-25 MED ORDER — NOREPINEPHRINE 4 MG/250ML-% IV SOLN: 0.0000 ug/min | Freq: Once | INTRAVENOUS | Status: DC

## 2017-12-25 MED ORDER — SODIUM CHLORIDE 0.9 % IV SOLN
1.0000 g | Freq: Once | INTRAVENOUS | Status: AC
  Administered 2017-12-25: 1000 mg via INTRAVENOUS
  Filled 2017-12-25: qty 1

## 2017-12-25 NOTE — ED Provider Notes (Signed)
[facility] Emergency Department Provider Note    First MD Initiated Contact with Patient 12/25/17 1651     (approximate)  I have reviewed the triage vital signs and the nursing notes.   HISTORY  Chief Complaint Flank Pain and Urinary Tract Infection    HPI Roberta Goodwin is a 57 y.o. female no significant past medical history presents the ER with chief complaint of abnormal urine culture.  Patient seen on the 30th urgent care for dysuria and flank pain.  Was given a shot of Rocephin and started on Cipro.  Has not had any fevers.  No nausea or vomiting.  No flank pain but is still having some urinary discomfort.  Patient was told to come to the ER as her urinalysis did test positive for ESBL E. coli resistant to Cipro.   Past Medical History:  Diagnosis Date  . Asthma   . Hypertension    Family History  Problem Relation Age of Onset  . Breast cancer Mother [id number]   Past Surgical History:  Procedure Laterality Date  . ABDOMINAL HYSTERECTOMY    . APPENDECTOMY    . BREAST BIOPSY Left 1994   negative   There are no active problems to display for this patient.     Prior to Admission medications   Medication Sig Start Date End Date Taking? Authorizing Provider  azithromycin (ZITHROMAX) 250 MG tablet 1 tab by mouth for 4 more days 06/15/17   Governor RooksLord, Rebecca, MD  fluconazole (DIFLUCAN) 150 MG tablet Take 1 tablet (150 mg total) by mouth daily for 1 day. 12/25/17 12/26/17  Willy Eddyobinson, Johne Buckle, MD  nitrofurantoin, macrocrystal-monohydrate, (MACROBID) 100 MG capsule Take 1 capsule (100 mg total) by mouth 2 (two) times daily for 5 days. 12/25/17 12/30/17  Willy Eddyobinson, Rayan Ines, MD  predniSONE (DELTASONE) 10 MG tablet 40mg  by mouth for 4 more days 06/15/17   Governor RooksLord, Rebecca, MD    Allergies Lisinopril and Prednisone    Social History Social History   Tobacco Use  . Smoking status: Never Smoker  . Smokeless tobacco: Never Used  Substance Use Topics  . Alcohol use:  Not Currently    Comment: occas.   . Drug use: Not on file    Review of Systems Patient denies headaches, rhinorrhea, blurry vision, numbness, shortness of breath, chest pain, edema, cough, abdominal pain, nausea, vomiting, diarrhea, dysuria, fevers, rashes or hallucinations unless otherwise stated above in HPI. ____________________________________________   PHYSICAL EXAM:  VITAL SIGNS: Vitals:   12/25/17 1900 12/25/17 1930  BP: (!) 164/96 (!) 152/89  Pulse: (!) 53 (!) 46  Resp: 18 (!) 22  Temp:    SpO2: 98% 95%    Constitutional: Alert and oriented.  Eyes: Conjunctivae are normal.  Head: Atraumatic. Nose: No congestion/rhinnorhea. Mouth/Throat: Mucous membranes are moist.   Neck: No stridor. Painless ROM.  Cardiovascular: Normal rate, regular rhythm. Grossly normal heart sounds.  Good peripheral circulation. Respiratory: Normal respiratory effort.  No retractions. Lungs CTAB. Gastrointestinal: Soft and nontender. No distention. No abdominal bruits. No CVA tenderness. Genitourinary:  Musculoskeletal: No lower extremity tenderness nor edema.  No joint effusions. Neurologic:  Normal speech and language. No gross focal neurologic deficits are appreciated. No facial droop Skin:  Skin is warm, dry and intact. No rash noted. Psychiatric: Mood and affect are normal. Speech and behavior are normal.  ____________________________________________   LABS (all labs ordered are listed, but only abnormal results are displayed)  Results for orders placed or performed during the hospital encounter  of 12/25/17 (from the past 24 hour(s))  CBC     Status: None   Collection Time: 12/25/17  2:52 PM  Result Value Ref Range   WBC 8.1 3.6 - 11.0 K/uL   RBC 4.40 3.80 - 5.20 MIL/uL   Hemoglobin 13.8 12.0 - 16.0 g/dL   HCT 29.5 62.1 - 30.8 %   MCV 90.0 80.0 - 100.0 fL   MCH 31.4 26.0 - 34.0 pg   MCHC 35.0 32.0 - 36.0 g/dL   RDW 65.7 84.6 - 96.2 %   Platelets 222 150 - 440 K/uL    Comprehensive metabolic panel     Status: Abnormal   Collection Time: 12/25/17  2:52 PM  Result Value Ref Range   Sodium 140 135 - 145 mmol/L   Potassium 3.7 3.5 - 5.1 mmol/L   Chloride 110 98 - 111 mmol/L   CO2 24 22 - 32 mmol/L   Glucose, Bld 108 (H) 70 - 99 mg/dL   BUN 20 6 - 20 mg/dL   Creatinine, Ser 9.52 0.44 - 1.00 mg/dL   Calcium 9.3 8.9 - 84.1 mg/dL   Total Protein 7.0 6.5 - 8.1 g/dL   Albumin 4.1 3.5 - 5.0 g/dL   AST 29 15 - 41 U/L   ALT 25 0 - 44 U/L   Alkaline Phosphatase 97 38 - 126 U/L   Total Bilirubin 0.5 0.3 - 1.2 mg/dL   GFR calc non Af Amer >60 >60 mL/min   GFR calc Af Amer >60 >60 mL/min   Anion gap 6 5 - 15  Urinalysis, Complete w Microscopic     Status: Abnormal   Collection Time: 12/25/17  2:52 PM  Result Value Ref Range   Color, Urine YELLOW (A) YELLOW   APPearance CLEAR (A) CLEAR   Specific Gravity, Urine 1.017 1.005 - 1.030   pH 7.0 5.0 - 8.0   Glucose, UA NEGATIVE NEGATIVE mg/dL   Hgb urine dipstick NEGATIVE NEGATIVE   Bilirubin Urine NEGATIVE NEGATIVE   Ketones, ur NEGATIVE NEGATIVE mg/dL   Protein, ur NEGATIVE NEGATIVE mg/dL   Nitrite NEGATIVE NEGATIVE   Leukocytes, UA NEGATIVE NEGATIVE   RBC / HPF 0-5 0 - 5 RBC/hpf   WBC, UA 0-5 0 - 5 WBC/hpf   Bacteria, UA NONE SEEN NONE SEEN   Squamous Epithelial / LPF NONE SEEN 0 - 5   Mucus PRESENT    ____________________________________________ ____________________________________________  RADIOLOGY   ____________________________________________   PROCEDURES  Procedure(s) performed:  Procedures    Critical Care performed: no ____________________________________________   INITIAL IMPRESSION / ASSESSMENT AND PLAN / ED COURSE  Pertinent labs & imaging results that were available during my care of the patient were reviewed by me and considered in my medical decision making (see chart for details).   DDX: uti, pyelo, esbl ecoli, dehydration  Roberta Goodwin is a 57 y.o. who presents  to the ED with presents to the ER as described above.  She is well-appearing.  She is afebrile Heema dynamically stable.  Clinically does not meet criteria for presentation consistent with pyelonephritis.  We will touch base with infectious disease for antibiotic recommendations.  Clinical Course as of Dec 26 2251  Fri Dec 25, 2017  1712 Discussed case with Dr. Algis Liming of infectious disease.  Given the patient's presentation this does not seem clinically consistent with acute pyelonephritis.  She seems to be improving symptomatically.  White count is improved urinalysis is clear.  Given culture data showing evidence of ESBL  will give single dose of IV ertapenem and start patient on Macrobid.  She greatly prefers this treatment option as of post to admission the hospital for continued ertapenem.  Clinically she is nontoxic.  We will give her these antibiotics as well as IV fluids and reassess.   [PR]  1934 Patient reassessed.  Requesting discharge home.  She is afebrile not septic not clinically consistent with pyonephritis at this time.  Has completed her dose of Invanz.  Will start on Macrobid per infectious disease recommendation.  Discussed signs and symptoms for which she should return immediately to the hospital.   [PR]    Clinical Course User Index [PR] Willy Eddy, MD     As part of my medical decision making, I reviewed the following data within the electronic MEDICAL RECORD NUMBER Nursing notes reviewed and incorporated, Labs reviewed, notes from prior ED visits .   ____________________________________________   FINAL CLINICAL IMPRESSION(S) / ED DIAGNOSES  Final diagnoses:  Acute cystitis without hematuria  ESBL (extended spectrum beta-lactamase) producing bacteria infection      NEW MEDICATIONS STARTED DURING THIS VISIT:  Discharge Medication List as of 12/25/2017  7:31 PM    START taking these medications   Details  nitrofurantoin, macrocrystal-monohydrate, (MACROBID) 100  MG capsule Take 1 capsule (100 mg total) by mouth 2 (two) times daily for 5 days., Starting Fri 12/25/2017, Until Wed 12/30/2017, Print         Note:  This document was prepared using Dragon voice recognition software and may include unintentional dictation errors.    Willy Eddy, MD 12/25/17 2253

## 2017-12-25 NOTE — ED Notes (Signed)
.   Pt is resting, Respirations even and unlabored, NAD. Stretcher lowest postion and locked. Call bell within reach. Denies any needs at this time RN will continue to monitor.    

## 2017-12-25 NOTE — ED Notes (Signed)
Called for room x1.no answer. 

## 2017-12-25 NOTE — Discharge Instructions (Signed)
Return immediately for any worsening symptoms including fevers, nausea vomiting lightheadedness or inability to take medications.

## 2017-12-25 NOTE — ED Notes (Signed)
Signature pad did not transfer onto computer screen.

## 2017-12-25 NOTE — ED Notes (Signed)
Pt called several times for room and did not answer, looked for patient in all of lobby still no response.

## 2017-12-25 NOTE — ED Notes (Signed)

## 2017-12-25 NOTE — ED Notes (Signed)
Called for room multiple times no answer

## 2017-12-25 NOTE — ED Triage Notes (Signed)
Pt states she was started on Cipro on 01/23/18 for a UTI, states she was called by urgent care [facility] to come in for iv antibiotics.  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

## 2017-12-26 LAB — URINE CULTURE: CULTURE: NO GROWTH

## 2018-01-06 ENCOUNTER — Emergency Department: Payer: 59

## 2018-01-06 ENCOUNTER — Other Ambulatory Visit: Payer: Self-pay

## 2018-01-06 ENCOUNTER — Encounter: Payer: Self-pay | Admitting: Emergency Medicine

## 2018-01-06 ENCOUNTER — Emergency Department
Admission: EM | Admit: 2018-01-06 | Discharge: 2018-01-06 | Disposition: A | Discharge status: Home / Self Care | Payer: 59 | Attending: Emergency Medicine | Admitting: Emergency Medicine

## 2018-01-06 DIAGNOSIS — J45909 Unspecified asthma, uncomplicated: Secondary | ICD-10-CM | POA: Diagnosis not present

## 2018-01-06 DIAGNOSIS — I10 Essential (primary) hypertension: Secondary | ICD-10-CM | POA: Insufficient documentation

## 2018-01-06 DIAGNOSIS — R109 Unspecified abdominal pain: Secondary | ICD-10-CM | POA: Diagnosis not present

## 2018-01-06 DIAGNOSIS — Z87440 Personal history of urinary (tract) infections: Secondary | ICD-10-CM | POA: Diagnosis not present

## 2018-01-06 DIAGNOSIS — R1011 Right upper quadrant pain: Secondary | ICD-10-CM | POA: Diagnosis not present

## 2018-01-06 DIAGNOSIS — R1031 Right lower quadrant pain: Secondary | ICD-10-CM | POA: Insufficient documentation

## 2018-01-06 DIAGNOSIS — Z79899 Other long term (current) drug therapy: Secondary | ICD-10-CM | POA: Insufficient documentation

## 2018-01-06 LAB — URINALYSIS, COMPLETE (UACMP) WITH MICROSCOPIC
Bacteria, UA: NONE SEEN
Bilirubin Urine: NEGATIVE
GLUCOSE, UA: NEGATIVE mg/dL
HGB URINE DIPSTICK: NEGATIVE
KETONES UR: NEGATIVE mg/dL
Leukocytes, UA: NEGATIVE
NITRITE: NEGATIVE
PH: 6 (ref 5.0–8.0)
PROTEIN: NEGATIVE mg/dL
Specific Gravity, Urine: 1.019 (ref 1.005–1.030)

## 2018-01-06 LAB — COMPREHENSIVE METABOLIC PANEL
ALK PHOS: 95 U/L (ref 38–126)
ALT: 21 U/L (ref 0–44)
ANION GAP: 7 (ref 5–15)
AST: 24 U/L (ref 15–41)
Albumin: 3.9 g/dL (ref 3.5–5.0)
BILIRUBIN TOTAL: 0.8 mg/dL (ref 0.3–1.2)
BUN: 15 mg/dL (ref 6–20)
CALCIUM: 9.6 mg/dL (ref 8.9–10.3)
CO2: 22 mmol/L (ref 22–32)
Chloride: 112 mmol/L — ABNORMAL HIGH (ref 98–111)
Creatinine, Ser: 0.53 mg/dL (ref 0.44–1.00)
GFR calc non Af Amer: >60 mL/min (ref >60)
Glucose, Bld: 130 mg/dL — ABNORMAL HIGH (ref 70–99)
Potassium: 3.2 mmol/L — ABNORMAL LOW (ref 3.5–5.1)
SODIUM: 141 mmol/L (ref 135–145)
TOTAL PROTEIN: 7.1 g/dL (ref 6.5–8.1)

## 2018-01-06 LAB — CBC
HCT: 44.3 % (ref 35.0–47.0)
HEMOGLOBIN: 14.7 g/dL (ref 12.0–16.0)
MCH: 30.7 pg (ref 26.0–34.0)
MCHC: 33.2 g/dL (ref 32.0–36.0)
MCV: 92.5 fL (ref 80.0–100.0)
PLATELETS: 207 10*3/uL (ref 150–440)
RBC: 4.79 MIL/uL (ref 3.80–5.20)
RDW: 13.5 % (ref 11.5–14.5)
WBC: 7.4 10*3/uL (ref 3.6–11.0)

## 2018-01-06 LAB — LIPASE, BLOOD: Lipase: 24 U/L (ref 11–51)

## 2018-01-06 MED ORDER — PHENAZOPYRIDINE HCL 95 MG PO TABS: 95.0000 mg | ORAL_TABLET | Freq: Three times a day (TID) | ORAL | 0 refills | Status: DC | PRN

## 2018-01-06 MED ORDER — IBUPROFEN 400 MG PO TABS
600.0000 mg | ORAL_TABLET | Freq: Once | ORAL | Status: DC
  Filled 2018-01-06: qty 2

## 2018-01-06 NOTE — ED Notes (Signed)
Spoke with pt about wait times and what to expect next. Advised pt that I am available for further questions if needed.  

## 2018-01-06 NOTE — ED Provider Notes (Signed)
[facility] Emergency Department Provider Note  ____________________________________________  Time seen: Approximately 9:27 PM  I have reviewed the triage vital signs and the nursing notes.   HISTORY  Chief Complaint Flank Pain   HPI Roberta Goodwin is a 57 y.o. female with a history of asthma, hypertension, and chronic back pain who presents for evaluation of right flank and abdominal pain.  Patient reports that 2 weeks ago she was diagnosed with a UTI.  The culture grew ESBL resistant to ertapenem and Macrobid.  She was given a dose of IV ertapenem and and was sent home on a p.o. prescription of Macrobid.  She reports finishing the antibiotics will continues to have flank pain.  She reports 2 weeks of constant dull right-sided flank pain that radiates down to her right abdominal regions.  No nausea or vomiting.  She still complaining of mild burning with urination.  No vaginal discharge.  No chest pain or shortness of breath, no pleuritic pain, no fever or chills.  Patient has had an appendectomy but no other abdominal surgeries.  Currently the pain is 8 out of 10.  Patient has had chickenpox, never had shingles, no rash.  Patient reports "I have chronic back pain so it is hard for me to determine if this pain is new or old".  Past Medical History:  Diagnosis Date  . Asthma   . Hypertension     Past Surgical History:  Procedure Laterality Date  . ABDOMINAL HYSTERECTOMY    . APPENDECTOMY    . BREAST BIOPSY Left 1994   negative    Prior to Admission medications   Medication Sig Start Date End Date Taking? Authorizing Provider  azithromycin (ZITHROMAX) 250 MG tablet 1 tab by mouth for 4 more days 06/15/17   Governor RooksLord, Rebecca, MD  phenazopyridine (PYRIDIUM) 95 MG tablet Take 1 tablet (95 mg total) by mouth 3 (three) times daily as needed for pain. 01/06/18   Nita SickleVeronese, Orchid, MD  predniSONE (DELTASONE) 10 MG tablet 40mg  by mouth for 4 more days 06/15/17    Governor RooksLord, Rebecca, MD    Allergies Lisinopril; Sulfa antibiotics; and Prednisone  Family History  Problem Relation Age of Onset  . Breast cancer Mother [id number]    Social History Social History   Tobacco Use  . Smoking status: Never Smoker  . Smokeless tobacco: Never Used  Substance Use Topics  . Alcohol use: Not Currently    Comment: occas.   . Drug use: Not on file    Review of Systems  Constitutional: Negative for fever. Eyes: Negative for visual changes. ENT: Negative for sore throat. Neck: No neck pain  Cardiovascular: Negative for chest pain. Respiratory: Negative for shortness of breath. Gastrointestinal: + R sided abdominal pain. No vomiting or diarrhea. Genitourinary: + dysuria and R flank pain Musculoskeletal: Negative for back pain. Skin: Negative for rash. Neurological: Negative for headaches, weakness or numbness. Psych: No SI or HI  ____________________________________________   PHYSICAL EXAM:  VITAL SIGNS: ED Triage Vitals  Enc Vitals Group     BP 01/06/18 1810 (!) 148/89     Pulse Rate 01/06/18 1810 (!) 53     Resp 01/06/18 1810 20     Temp 01/06/18 1810 (!) 97.4 F (36.3 C)     Temp Source 01/06/18 1810 Oral     SpO2 01/06/18 1810 100 %     Weight 01/06/18 1811 179 lb 14.3 oz (81.6 kg)     Height 01/06/18 1811 5\' 6"  (1.676  m)     Head Circumference --      Peak Flow --      Pain Score 01/06/18 1811 7     Pain Loc --      Pain Edu? --      Excl. in GC? --     Constitutional: Alert and oriented. Well appearing and in no apparent distress. HEENT:      Head: Normocephalic and atraumatic.         Eyes: Conjunctivae are normal. Sclera is non-icteric.       Mouth/Throat: Mucous membranes are moist.       Neck: Supple with no signs of meningismus. Cardiovascular: Regular rate and rhythm. No murmurs, gallops, or rubs. 2+ symmetrical distal pulses are present in all extremities. No JVD. Respiratory: Normal respiratory effort. Lungs are clear to  auscultation bilaterally. No wheezes, crackles, or rhonchi.  Gastrointestinal: Soft, mild diffuse R quadrants tenderness to palpation, and non distended with positive bowel sounds. No rebound or guarding. Genitourinary: R CVA tenderness. Musculoskeletal: Nontender with normal range of motion in all extremities. No edema, cyanosis, or erythema of extremities. Neurologic: Normal speech and language. Face is symmetric. Moving all extremities. No gross focal neurologic deficits are appreciated. Skin: Skin is warm, dry and intact. No rash noted. Psychiatric: Mood and affect are normal. Speech and behavior are normal.  ____________________________________________   LABS (all labs ordered are listed, but only abnormal results are displayed)  Labs Reviewed  COMPREHENSIVE METABOLIC PANEL - Abnormal; Notable for the following components:      Result Value   Potassium 3.2 (*)    Chloride 112 (*)    Glucose, Bld 130 (*)    All other components within normal limits  URINALYSIS, COMPLETE (UACMP) WITH MICROSCOPIC - Abnormal; Notable for the following components:   Color, Urine YELLOW (*)    APPearance CLEAR (*)    All other components within normal limits  LIPASE, BLOOD  CBC   ____________________________________________  EKG  none  ____________________________________________  RADIOLOGY  I have personally reviewed the images performed during this visit and I agree with the Radiologist's read.   Interpretation by Radiologist:  Ct Abdomen Pelvis Wo Contrast  Result Date: 01/06/2018 CLINICAL DATA:  Right-sided flank pain for 2 weeks EXAM: CT ABDOMEN AND PELVIS WITHOUT CONTRAST TECHNIQUE: Multidetector CT imaging of the abdomen and pelvis was performed following the standard protocol without IV contrast. COMPARISON:  CT 09/12/2014 FINDINGS: Lower chest: Bleb at the left lung base. No acute consolidation or effusion. Normal heart size. Hepatobiliary: No focal liver abnormality is seen. No  gallstones, gallbladder wall thickening, or biliary dilatation. Pancreas: Unremarkable. No pancreatic ductal dilatation or surrounding inflammatory changes. Spleen: Normal in size without focal abnormality. Adrenals/Urinary Tract: Adrenal glands are unremarkable. Kidneys are normal, without renal calculi, focal lesion, or hydronephrosis. Bladder is unremarkable. Stomach/Bowel: Stomach is within normal limits. History of appendectomy. No evidence of bowel wall thickening, distention, or inflammatory changes. Vascular/Lymphatic: No significant vascular findings are present. No enlarged abdominal or pelvic lymph nodes. Reproductive: Status post hysterectomy. No adnexal masses. Other: Negative for free air or free fluid. Fat in the umbilical region Musculoskeletal: No acute or significant osseous findings. IMPRESSION: No CT evidence for acute intra-abdominal or pelvic abnormality. Electronically Signed   By: Jasmine Pang M.D.   On: 01/06/2018 21:20     ____________________________________________   PROCEDURES  Procedure(s) performed: None Procedures Critical Care performed:  None ____________________________________________   INITIAL IMPRESSION / ASSESSMENT AND PLAN / ED COURSE  57 y.o. female with a history of asthma, hypertension, and chronic back pain who presents for evaluation of 2 weeks of constant right flank and right sided abdominal pain.  Patient is well-appearing, no distress, she has normal vital signs, patient with right flank pain and right quadrants tenderness to palpation.  Was sent for a CT scan to rule out any renal pathology such as mass or cysts, rule out SBO, rule out kidney stone.  CT is negative for any acute pathology.  UA is clean and shows resolution of her UTI.  Labs including CBC, CMP and lipase are within normal limits.  There is no rash consistent with shingles. Unclear etiology of patient's pain but with normal labs, negative CT in the setting of 2 weeks of ongoing  symptoms, patient is stable for outpatient management with PCP. Ibuprofen given for pain. Will provide pyridium for dysuria. Discussed return precautions for new or worsening abdominal pain, fever, dysuria, chest pain, shortness of breath.      As part of my medical decision making, I reviewed the following data within the electronic MEDICAL RECORD NUMBER Nursing notes reviewed and incorporated, Labs reviewed , Old chart reviewed, Radiograph reviewed , Notes from prior ED visits and [place] Controlled Substance Database    Pertinent labs & imaging results that were available during my care of the patient were reviewed by me and considered in my medical decision making (see chart for details).    ____________________________________________   FINAL CLINICAL IMPRESSION(S) / ED DIAGNOSES  Final diagnoses:  Flank pain  Abdominal pain, unspecified abdominal location      NEW MEDICATIONS STARTED DURING THIS VISIT:  ED Discharge Orders         Ordered    phenazopyridine (PYRIDIUM) 95 MG tablet  3 times daily PRN     01/06/18 2134           Note:  This document was prepared using Dragon voice recognition software and may include unintentional dictation errors.    Nita SickleVeronese, [place], MD 01/06/18 2135

## 2018-01-06 NOTE — ED Triage Notes (Addendum)
Patient states she was recently seen in ED and diagnosed with UTI. Reports culture grew ESBL. Has been taking macrobid with no relief of symptoms. Patient states increasing flank pain and states "I just feel terrible".

## 2018-01-06 NOTE — Discharge Instructions (Addendum)

## 2018-01-07 DIAGNOSIS — G4730 Sleep apnea, unspecified: Secondary | ICD-10-CM | POA: Diagnosis not present

## 2018-07-14 ENCOUNTER — Emergency Department (HOSPITAL_COMMUNITY)

## 2018-07-14 ENCOUNTER — Other Ambulatory Visit: Payer: Self-pay

## 2018-07-14 ENCOUNTER — Emergency Department (HOSPITAL_COMMUNITY)
Admission: EM | Admit: 2018-07-14 | Discharge: 2018-07-15 | Disposition: A | Discharge status: Home / Self Care | Attending: Emergency Medicine | Admitting: Emergency Medicine

## 2018-07-14 DIAGNOSIS — R1010 Upper abdominal pain, unspecified: Secondary | ICD-10-CM

## 2018-07-14 DIAGNOSIS — R0789 Other chest pain: Secondary | ICD-10-CM | POA: Diagnosis present

## 2018-07-14 DIAGNOSIS — R0602 Shortness of breath: Secondary | ICD-10-CM | POA: Diagnosis not present

## 2018-07-14 DIAGNOSIS — I10 Essential (primary) hypertension: Secondary | ICD-10-CM | POA: Insufficient documentation

## 2018-07-14 LAB — BASIC METABOLIC PANEL
Anion gap: 8 (ref 5–15)
BUN: 14 mg/dL (ref 6–20)
CHLORIDE: 107 mmol/L (ref 98–111)
CO2: 26 mmol/L (ref 22–32)
Calcium: 9.7 mg/dL (ref 8.9–10.3)
Creatinine, Ser: 0.8 mg/dL (ref 0.44–1.00)
GFR calc Af Amer: >60 mL/min (ref >60)
Glucose, Bld: 99 mg/dL (ref 70–99)
POTASSIUM: 3.3 mmol/L — AB (ref 3.5–5.1)
SODIUM: 141 mmol/L (ref 135–145)

## 2018-07-14 LAB — CBC
HEMATOCRIT: 42.5 % (ref 36.0–46.0)
HEMOGLOBIN: 13.7 g/dL (ref 12.0–15.0)
MCH: 29.3 pg (ref 26.0–34.0)
MCHC: 32.2 g/dL (ref 30.0–36.0)
MCV: 90.8 fL (ref 80.0–100.0)
NRBC: 0 % (ref 0.0–0.2)
PLATELETS: 225 10*3/uL (ref 150–400)
RBC: 4.68 MIL/uL (ref 3.87–5.11)
RDW: 12.8 % (ref 11.5–15.5)
WBC: 9.9 10*3/uL (ref 4.0–10.5)

## 2018-07-14 LAB — LIPASE, BLOOD: Lipase: 24 U/L (ref 11–51)

## 2018-07-14 LAB — I-STAT TROPONIN, ED: TROPONIN I, POC: 0 ng/mL (ref 0.00–0.08)

## 2018-07-14 MED ORDER — SODIUM CHLORIDE 0.9% FLUSH
3.0000 mL | Freq: Once | INTRAVENOUS | Status: AC
  Administered 2018-07-15: 3 mL via INTRAVENOUS

## 2018-07-14 NOTE — ED Triage Notes (Signed)
Patient c/o intermittent CP since Monday (3 days) that radiates to back and shoulders.

## 2018-07-14 NOTE — ED Notes (Signed)
Triage was completed by Royetta Crochet; charted under wrong employee.

## 2018-07-15 LAB — HEPATIC FUNCTION PANEL
ALT: 23 U/L (ref 0–44)
AST: 24 U/L (ref 15–41)
Albumin: 3.9 g/dL (ref 3.5–5.0)
Alkaline Phosphatase: 72 U/L (ref 38–126)
Total Bilirubin: 0.4 mg/dL (ref 0.3–1.2)
Total Protein: 7.1 g/dL (ref 6.5–8.1)

## 2018-07-15 LAB — D-DIMER, QUANTITATIVE: D-Dimer, Quant: 0.33 ug/mL-FEU (ref 0.00–0.50)

## 2018-07-15 MED ORDER — MORPHINE SULFATE (PF) 4 MG/ML IV SOLN
4.0000 mg | Freq: Once | INTRAVENOUS | Status: AC
  Administered 2018-07-15: 4 mg via INTRAVENOUS
  Filled 2018-07-15: qty 1

## 2018-07-15 NOTE — ED Notes (Signed)
Lab to add-on additional blood work.

## 2018-07-15 NOTE — Discharge Instructions (Signed)
Thank you for allowing me to care for you today in the Emergency Department.   Your work-up today in the ER was reassuring for both your abdomen and your chest.   As we discussed, you can try baking soda for gastrointestinal discomfort, including burping, belching, and gas.  You can continue to try Gas-X.  You can also try taking a stool softener.  You can take thousand milligrams of ibuprofen once every 8 hours for pain control.  Avoid ibuprofen due to your upcoming colonoscopy.  Follow-up with gastroenterology if your symptoms do not improve.  Return to the emergency department if you develop chest pain with severe shortness of breath, sweating, arm or neck pain, limegreen or persistent vomiting, severe abdominal pain with a fever, or other new, concerning symptoms.

## 2018-07-15 NOTE — ED Notes (Signed)
ED Provider at bedside. 

## 2018-07-15 NOTE — ED Notes (Signed)
Patient verbalizes understanding of discharge instructions. Opportunity for questioning and answers were provided. Armband removed by staff, pt discharged from ED ambulatory.   

## 2018-07-15 NOTE — ED Provider Notes (Signed)
[facility] Provider Note   CSN: [id number] Arrival date & time: 07/14/18  2246    History   Chief Complaint Chief Complaint  Patient presents with  . Chest Pain    HPI Roberta Goodwin is a 58 y.o. female with a history of asthma and hypertension who presents to the emergency department with a chief complaint of chest pain.  The patient endorses constant, pressure-like chest pain in the middle of her chest and under her left breast that began 2 nights ago with associated burping and belching.  States she is feeling full. States the pain is worse when she takes a deep breath.  No other known aggravating or alleviating factors.  She reports that yesterday she developed pain in her left shoulder blade.  She reports that the pain in the left shoulder blade feels similar, but does not seem as if it radiates.  She does report that she felt more short of breath walking up the stairs yesterday.  She denies fevers, chills, cough, palpitations, leg swelling, nausea, vomiting, patient, diarrhea, neck pain, arm pain, diaphoresis, or visual changes.  She reports that she has been treating her symptoms with Gas-X and part of a bottle of magnesium citrate.  Last bowel movement was this morning.  No melena or hematochezia.  No recent long travel.  No hormonal birth control.  No personal or family history of DVT or PE.  She does report a family history of hypertension and heart disease, but she is unsure of age of onset for family members.  No recent surgery or immobilization.  No recent dietary changes.  No history of similar pain.  He is a never smoker.  Surgical history includes abdominal hysterectomy and appendectomy.     The history is provided by the patient. No language interpreter was used.    Past Medical History:  Diagnosis Date  . Asthma   . Hypertension     There are no active problems to display for this patient.   Past Surgical History:    Procedure Laterality Date  . ABDOMINAL HYSTERECTOMY    . APPENDECTOMY    . BREAST BIOPSY Left 1994   negative     OB History   No obstetric history on file.      Home Medications    Prior to Admission medications   Medication Sig Start Date End Date Taking? Authorizing Provider  losartan (COZAAR) 50 MG tablet Take 50 mg by mouth daily. 07/01/18  Yes [provider]    Family History Family History  Problem Relation Age of Onset  . Breast cancer Mother 10543    Social History Social History   Tobacco Use  . Smoking status: Never Smoker  . Smokeless tobacco: Never Used  Substance Use Topics  . Alcohol use: Not Currently    Comment: occas.   . Drug use: Not on file     Allergies   Lisinopril; Sulfa antibiotics; and Prednisone   Review of Systems Review of Systems  Constitutional: Negative for activity change, chills and fever.  HENT: Negative for congestion.   Eyes: Negative for visual disturbance.  Respiratory: Positive for shortness of breath. Negative for cough and wheezing.   Cardiovascular: Positive for chest pain. Negative for palpitations and leg swelling.  Gastrointestinal: Positive for abdominal pain. Negative for diarrhea, nausea and vomiting.  Genitourinary: Negative for dysuria, frequency and urgency.  Musculoskeletal: Negative for back pain.  Skin: Negative for rash.  Allergic/Immunologic: Negative for immunocompromised state.  Neurological: Negative for dizziness, syncope, weakness, numbness and headaches.  Psychiatric/Behavioral: Negative for confusion.     Physical Exam Updated Vital Signs BP (!) 148/71   Pulse (!) 55   Temp 98 F (36.7 C) (Oral)   Resp 18   Ht 5\' 6"  (1.676 m)   Wt 88.5 kg   SpO2 96%   BMI 31.47 kg/m   Physical Exam Vitals signs and nursing note reviewed.  Constitutional:      General: She is not in acute distress. HENT:     Head: Normocephalic.  Eyes:     Conjunctiva/sclera: Conjunctivae normal.   Neck:     Musculoskeletal: Neck supple.  Cardiovascular:     Rate and Rhythm: Normal rate and regular rhythm.     Pulses:          Radial pulses are 2+ on the right side and 2+ on the left side.       Dorsalis pedis pulses are 2+ on the right side and 2+ on the left side.     Heart sounds: Normal heart sounds. No murmur. No friction rub. No gallop.   Pulmonary:     Effort: Pulmonary effort is normal. No tachypnea, accessory muscle usage or respiratory distress.     Breath sounds: No stridor. No wheezing, rhonchi or rales.  Chest:     Chest wall: No mass, deformity or tenderness.  Abdominal:     General: Bowel sounds are normal. There is no distension or abdominal bruit.     Palpations: Abdomen is soft.     Tenderness: There is abdominal tenderness. There is no guarding or rebound.     Comments: Tender to palpation in the bilateral upper abdomen without rebound or guarding.  No CVA tenderness bilaterally.  Lower abdomen is unremarkable.  Skin:    General: Skin is warm.     Coloration: Skin is not cyanotic.     Findings: No rash.  Neurological:     Mental Status: She is alert.  Psychiatric:        Behavior: Behavior normal.      ED Treatments / Results  Labs (all labs ordered are listed, but only abnormal results are displayed) Labs Reviewed  BASIC METABOLIC PANEL - Abnormal; Notable for the following components:      Result Value   Potassium 3.3 (*)    All other components within normal limits  CBC  LIPASE, BLOOD  HEPATIC FUNCTION PANEL  D-DIMER, QUANTITATIVE (NOT AT [facility])  I-STAT TROPONIN, ED    EKG EKG Interpretation  Date/Time:  Wednesday July 14 2018 22:58:40 EST Ventricular Rate:  67 PR Interval:  168 QRS Duration: 88 QT Interval:  390 QTC Calculation: 412 R Axis:   30 Text Interpretation:  Normal sinus rhythm Low voltage QRS T wave abnormality, consider anterior ischemia Abnormal ECG When compared with ECG of 06/15/2017, HEART RATE has decreased  Confirmed by Dione Booze ([id number]) on 07/15/2018 3:36:22 AM   Radiology Dg Chest 2 View  Result Date: 07/14/2018 CLINICAL DATA:  Chest pain EXAM: CHEST - 2 VIEW COMPARISON:  None. FINDINGS: The heart size and mediastinal contours are within normal limits. Both lungs are clear. Degenerative changes of the spine. IMPRESSION: No active cardiopulmonary disease. Electronically Signed   By: Jasmine Pang M.D.   On: 07/14/2018 23:20    Procedures Ultrasound ED Abd Date/Time: 07/15/2018 8:24 AM Performed by: Barkley Boards, PA-C Authorized by: Barkley Boards, PA-C   Procedure details:    Indications: abdominal  pain     Assessment for:  Gallstones   Hepatobiliary:  Visualized   Images: archived    Hepatobiliary findings:    Gallbladder stones: not identified     Sonographic Murphy's sign: negative     (including critical care time)  Medications Ordered in ED Medications  sodium chloride flush (NS) 0.9 % injection 3 mL (3 mLs Intravenous Given 07/15/18 0358)  morphine 4 MG/ML injection 4 mg (4 mg Intravenous Given 07/15/18 0357)     Initial Impression / Assessment and Plan / ED Course  I have reviewed the triage vital signs and the nursing notes.  Pertinent labs & imaging results that were available during my care of the patient were reviewed by me and considered in my medical decision making (see chart for details).        58 year old female with history of asthma and hypertension presenting with chest pain, abdominal pain, and shortness of breath for the last 2 days.  She characterizes chest pain as pressure-like and pleuritic.  EKG with heart rate decreased from previous, but otherwise no acute changes.  Chest x-ray is unremarkable.  Troponin is negative.  Delta troponin is not indicated.  Chest pain is not exertional and have a lower suspicion for ACS at this time.  Given pleuritic nature of pain, d-dimer was obtained and is negative.  Low suspicion for pericarditis, myocarditis,  aortic dissection, or esophageal rupture.  Labs are otherwise grossly reassuring.  She has been having GI discomfort as well and I suspect this may be the etiology of her chest discomfort.  Bedside ultrasound performed by myself and Dr. Eudelia Bunch, attending physician, with no large gallstones.  Hepatic panel was also reassuring.  At this time, I am uncertain of the etiology the patient's symptoms, but low suspicion for cholecystitis, pancreatitis, peptic ulcer disease, or bowel obstruction.  Recommended sodium bicarbonate for GI discomfort and stool softener.  She is scheduled for a colonoscopy with her gastroenterologist in 5 days.  At this time, she is hemodynamically stable and in no acute distress.  She was given strict return precautions to the emergency department.  She is safe for discharge home with outpatient follow-up at this time.  Final Clinical Impressions(s) / ED Diagnoses   Final diagnoses:  Pain of upper abdomen    ED Discharge Orders    None       Barkley Boards, PA-C 07/15/18 0831    Nira Conn, MD 07/16/18 0730

## 2018-07-20 ENCOUNTER — Other Ambulatory Visit: Payer: Self-pay | Admitting: Family Medicine

## 2018-07-20 DIAGNOSIS — Z1231 Encounter for screening mammogram for malignant neoplasm of breast: Secondary | ICD-10-CM

## 2018-09-17 ENCOUNTER — Other Ambulatory Visit: Payer: Self-pay | Admitting: Orthopaedic Surgery

## 2018-09-17 DIAGNOSIS — M542 Cervicalgia: Secondary | ICD-10-CM

## 2018-11-02 ENCOUNTER — Other Ambulatory Visit: Payer: Self-pay

## 2018-11-02 ENCOUNTER — Ambulatory Visit
Admission: RE | Admit: 2018-11-02 | Discharge: 2018-11-02 | Disposition: A | Discharge status: Home / Self Care | Source: Ambulatory Visit | Attending: Orthopaedic Surgery | Admitting: Orthopaedic Surgery

## 2018-11-02 DIAGNOSIS — M542 Cervicalgia: Secondary | ICD-10-CM

## 2019-10-17 ENCOUNTER — Other Ambulatory Visit: Payer: Self-pay | Admitting: Orthopaedic Surgery

## 2019-10-17 DIAGNOSIS — M5412 Radiculopathy, cervical region: Secondary | ICD-10-CM

## 2019-11-17 ENCOUNTER — Ambulatory Visit
Admission: RE | Admit: 2019-11-17 | Discharge: 2019-11-17 | Disposition: A | Discharge status: Home / Self Care | Source: Ambulatory Visit | Attending: Orthopaedic Surgery | Admitting: Orthopaedic Surgery

## 2019-11-17 ENCOUNTER — Other Ambulatory Visit: Payer: Self-pay

## 2019-11-17 DIAGNOSIS — M5412 Radiculopathy, cervical region: Secondary | ICD-10-CM

## 2020-09-02 IMAGING — MR MR CERVICAL SPINE W/O CM
5 series · 32 of 48 positions shown · non-contrast
Comparison: MRI of the cervical spine November 02, 2018

CLINICAL DATA: Brachial neuritis

EXAM:
MRI CERVICAL SPINE WITHOUT CONTRAST
TECHNIQUE: Multiplanar, multisequence MR imaging of the cervical spine was
performed. No intravenous contrast was administered.

[Series 2: T2 · sagittal · 3.0mm · 0.41mm/px · 6 of 13 slices shown (1 of 2)]
[im 1/13]
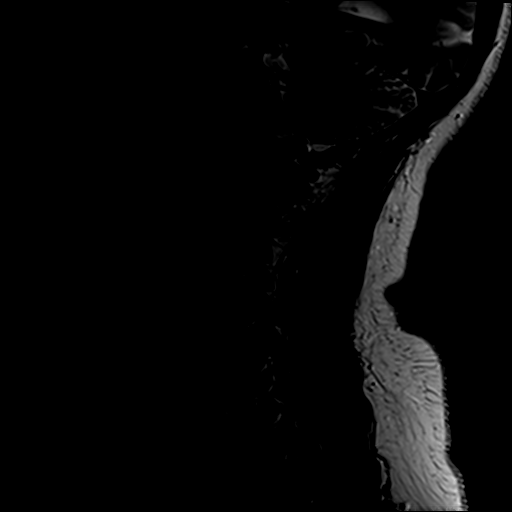
[im 3/13]
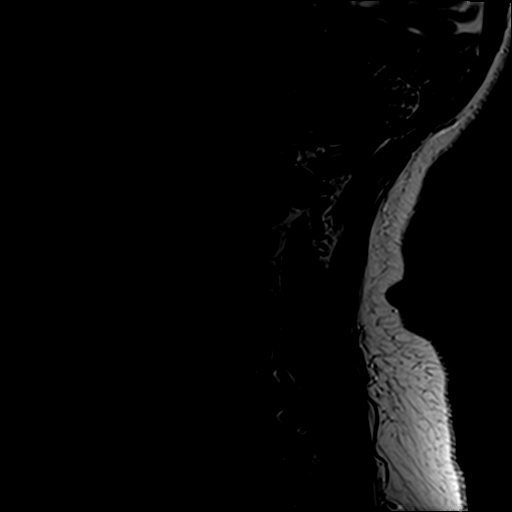
[im 5/13]
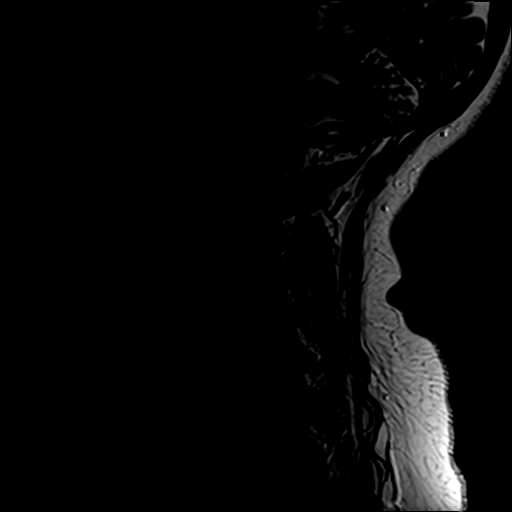
[im 8/13]
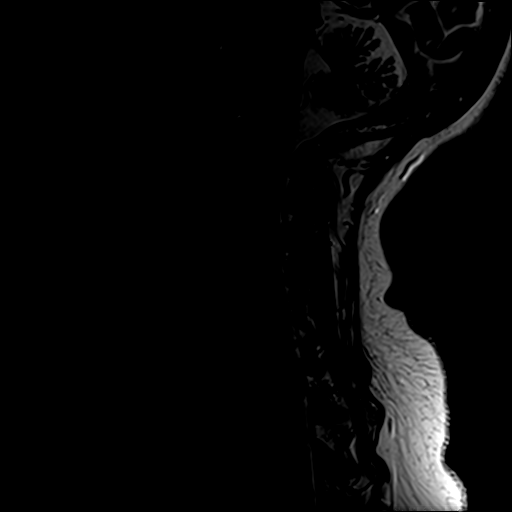
[im 10/13]
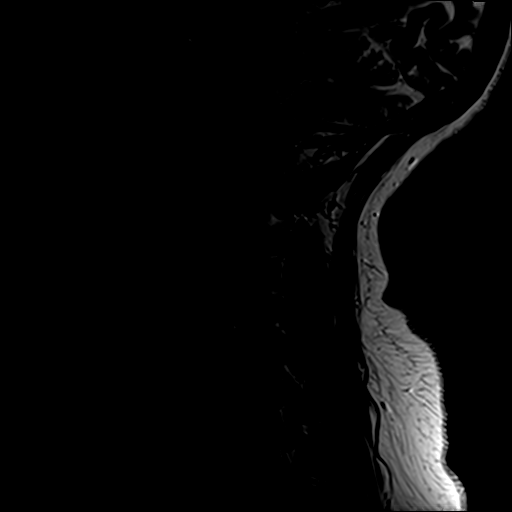
[im 13/13]
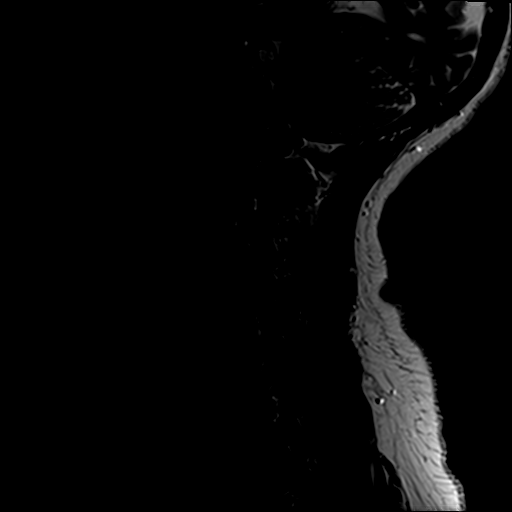

[Series 3: T1 · sagittal · 3.0mm · 0.41mm/px · 7 of 13 slices shown]
[im 1/13]
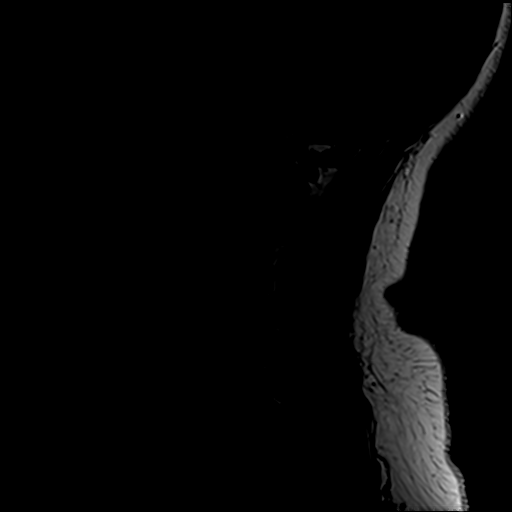
[im 3/13]
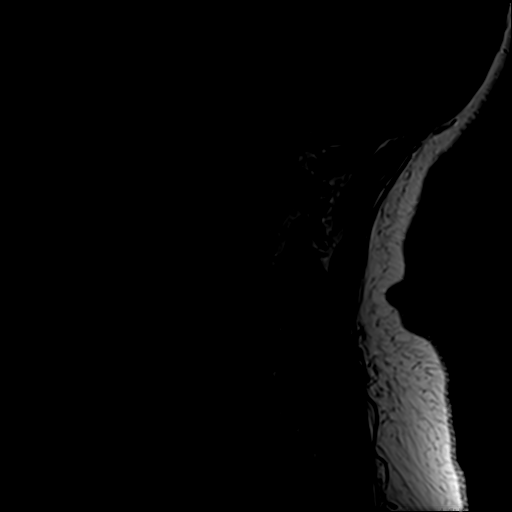
[im 5/13]
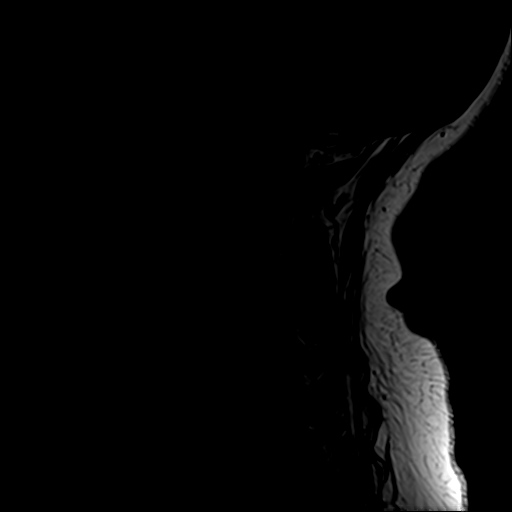
[im 7/13]
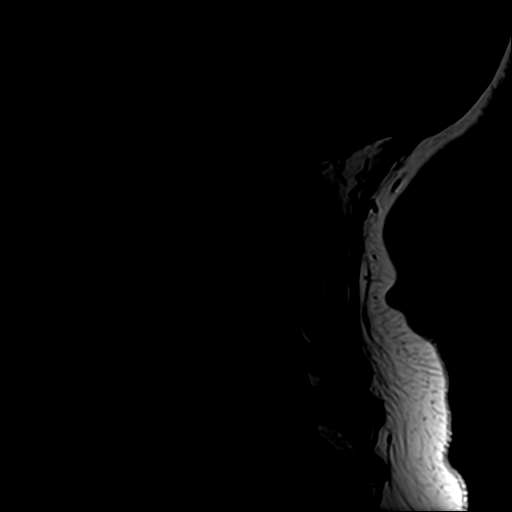
[im 9/13]
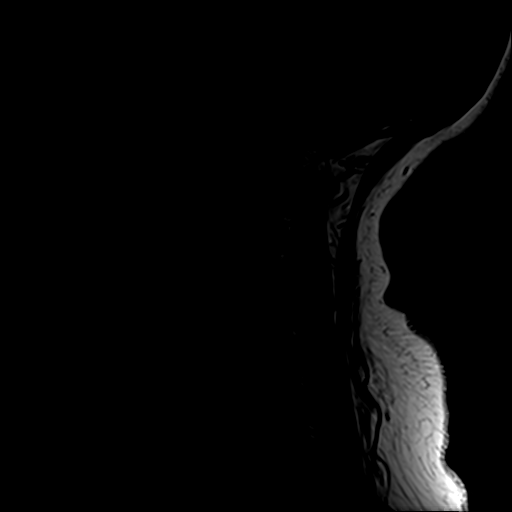
[im 11/13]
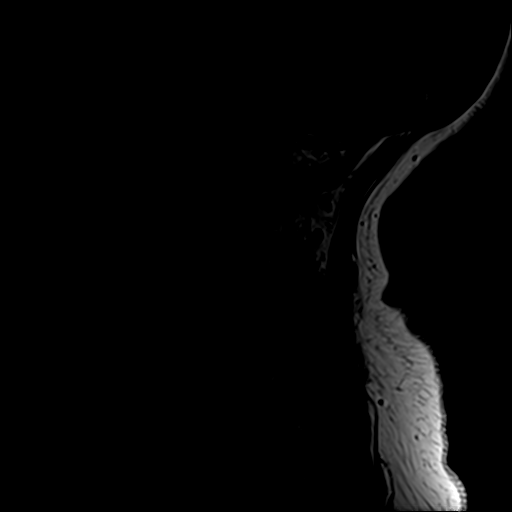
[im 13/13]
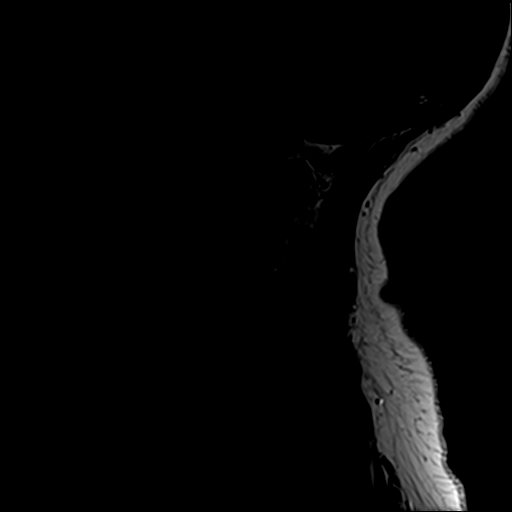

[Series 4: STIR · sagittal · 3.0mm · 0.82mm/px · 7 of 13 slices shown]
[im 1/13]
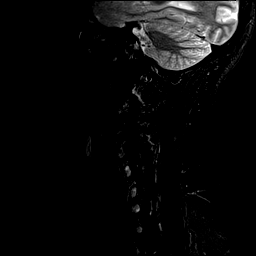
[im 3/13]
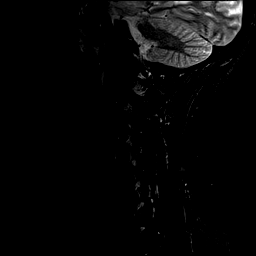
[im 5/13]
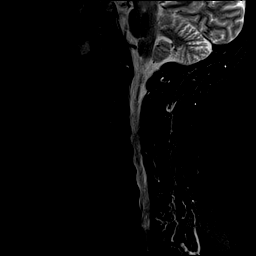
[im 7/13]
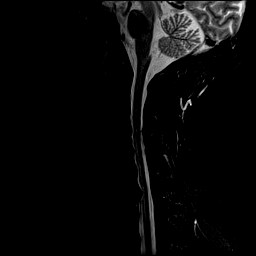
[im 9/13]
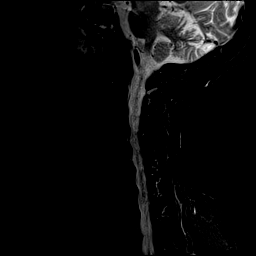
[im 11/13]
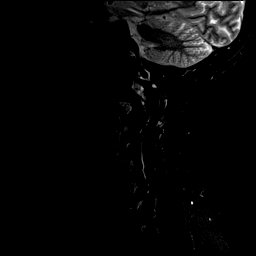
[im 13/13]
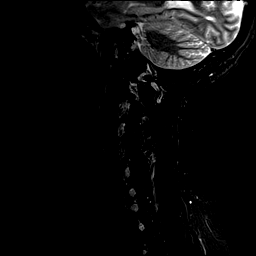

[Series 5: GRE · axial · 3.0mm · 0.47mm/px · z∈[-57,-12]mm · 4 of 28 slices shown]
[im 1/28]
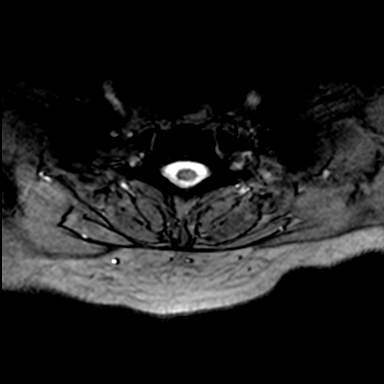
[im 5/28]
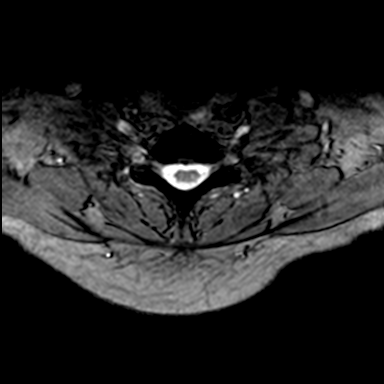
[im 9/28]
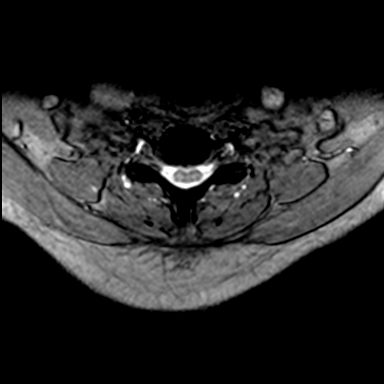
[im 13/28]
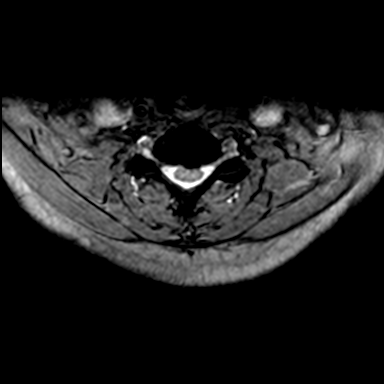

[Series 6: T2 · axial · 3.0mm · 0.94mm/px · z∈[-57,+44]mm · 8 of 27 slices shown (2 of 2)]
[im 1/27]
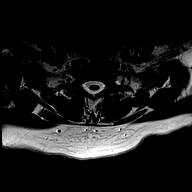
[im 5/27]
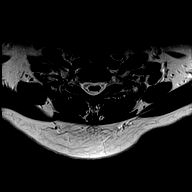
[im 9/27]
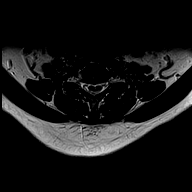
[im 13/27]
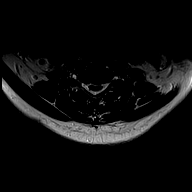
[im 15/27]
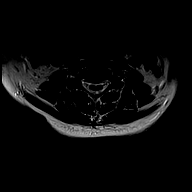
[im 19/27]
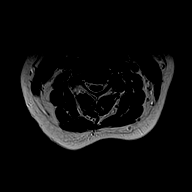
[im 23/27]
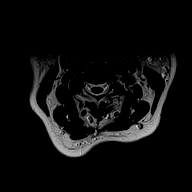
[im 27/27]
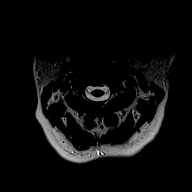

[32 of 48 positions shown; findings below may reference images not displayed]

FINDINGS: Alignment: Mild reversal of the cervical curvature.

Vertebrae: No fracture, evidence of discitis, or bone lesion.

Cord: Mild anterior cord flattening at C6-7 without cord signal
abnormality.

Posterior Fossa, vertebral arteries, paraspinal tissues: Mucosal
thickening of the sphenoid sinus is incidentally noted.

Disc levels:

C2-3: No spinal canal or neural foraminal stenosis.

C3-4: Small posterior disc protrusion causing small indentation of
the thecal sac without significant spinal canal stenosis.
Uncovertebral and facet degenerative changes resulting in moderate
right and mild left neural foraminal narrowing, unchanged.

C4-5: Small posterior disc protrusion without significant spinal
canal stenosis. Uncovertebral and facet degenerative changes
resulting in moderate to severe right and moderate left neural
foraminal narrowing, unchanged.

C5-6: Small posterior disc protrusion without significant spinal
canal stenosis. Uncovertebral and facet degenerative changes
resulting in moderate to severe bilateral neural foraminal
narrowing, unchanged.

C6-7: Central disc protrusion causing mild flattening of the
anterior cord without cord signal abnormality. This has progressed
since prior MRI. Uncovertebral and facet degenerative changes
resulting mild right neural foraminal narrowing, unchanged.

C7-T1: No spinal canal or neural foraminal stenosis.
IMPRESSION: 1. Central disc protrusion at C6-7 causing mild flattening of the
anterior cord without cord signal abnormality. This has progressed
since prior MRI.
2. Otherwise unchanged multilevel spondylosis with multilevel neural
foraminal narrowing, moderate to severe on the right at C4-5 and
bilaterally at C5-6.
